# Patient Record
Sex: Female | Born: 1970
Health system: Southern US, Community
[De-identification: ages and names within clinical notes are randomized; demographics above are authoritative.]

## PROBLEM LIST (undated history)

## (undated) DIAGNOSIS — E0500 Thyrotoxicosis with diffuse goiter without thyrotoxic crisis or storm: Secondary | ICD-10-CM

## (undated) DIAGNOSIS — T7840XA Allergy, unspecified, initial encounter: Secondary | ICD-10-CM

## (undated) DIAGNOSIS — R011 Cardiac murmur, unspecified: Secondary | ICD-10-CM

## (undated) HISTORY — DX: Thyrotoxicosis with diffuse goiter without thyrotoxic crisis or storm: E05.00

## (undated) HISTORY — DX: Allergy, unspecified, initial encounter: T78.40XA

## (undated) HISTORY — DX: Cardiac murmur, unspecified: R01.1

## (undated) HISTORY — PX: OTHER SURGICAL HISTORY: SHX169

---

## 2010-08-26 ENCOUNTER — Other Ambulatory Visit (HOSPITAL_COMMUNITY): Payer: Self-pay | Admitting: Obstetrics and Gynecology

## 2010-08-26 DIAGNOSIS — R928 Other abnormal and inconclusive findings on diagnostic imaging of breast: Secondary | ICD-10-CM

## 2010-09-02 ENCOUNTER — Ambulatory Visit
Admission: RE | Admit: 2010-09-02 | Discharge: 2010-09-02 | Disposition: A | Discharge status: Home / Self Care | Payer: Self-pay | Source: Ambulatory Visit | Attending: Obstetrics and Gynecology | Admitting: Obstetrics and Gynecology

## 2010-09-02 DIAGNOSIS — R928 Other abnormal and inconclusive findings on diagnostic imaging of breast: Secondary | ICD-10-CM

## 2011-10-11 ENCOUNTER — Ambulatory Visit: Payer: Self-pay | Admitting: Obstetrics and Gynecology

## 2015-07-14 DIAGNOSIS — F4320 Adjustment disorder, unspecified: Secondary | ICD-10-CM | POA: Diagnosis not present

## 2015-08-19 DIAGNOSIS — F4320 Adjustment disorder, unspecified: Secondary | ICD-10-CM | POA: Diagnosis not present

## 2015-09-24 DIAGNOSIS — L709 Acne, unspecified: Secondary | ICD-10-CM | POA: Diagnosis not present

## 2015-09-24 DIAGNOSIS — E890 Postprocedural hypothyroidism: Secondary | ICD-10-CM | POA: Diagnosis not present

## 2015-09-24 DIAGNOSIS — E0500 Thyrotoxicosis with diffuse goiter without thyrotoxic crisis or storm: Secondary | ICD-10-CM | POA: Diagnosis not present

## 2015-09-24 DIAGNOSIS — E063 Autoimmune thyroiditis: Secondary | ICD-10-CM | POA: Diagnosis not present

## 2015-09-24 DIAGNOSIS — E785 Hyperlipidemia, unspecified: Secondary | ICD-10-CM | POA: Diagnosis not present

## 2015-10-01 DIAGNOSIS — E0500 Thyrotoxicosis with diffuse goiter without thyrotoxic crisis or storm: Secondary | ICD-10-CM | POA: Diagnosis not present

## 2015-10-01 DIAGNOSIS — L709 Acne, unspecified: Secondary | ICD-10-CM | POA: Diagnosis not present

## 2015-10-01 DIAGNOSIS — E890 Postprocedural hypothyroidism: Secondary | ICD-10-CM | POA: Diagnosis not present

## 2015-10-13 DIAGNOSIS — L709 Acne, unspecified: Secondary | ICD-10-CM | POA: Diagnosis not present

## 2015-10-13 DIAGNOSIS — E785 Hyperlipidemia, unspecified: Secondary | ICD-10-CM | POA: Diagnosis not present

## 2015-10-13 DIAGNOSIS — Z13820 Encounter for screening for osteoporosis: Secondary | ICD-10-CM | POA: Diagnosis not present

## 2015-10-13 DIAGNOSIS — D249 Benign neoplasm of unspecified breast: Secondary | ICD-10-CM | POA: Diagnosis not present

## 2015-10-13 DIAGNOSIS — E063 Autoimmune thyroiditis: Secondary | ICD-10-CM | POA: Diagnosis not present

## 2015-10-13 DIAGNOSIS — E890 Postprocedural hypothyroidism: Secondary | ICD-10-CM | POA: Diagnosis not present

## 2015-10-13 DIAGNOSIS — E0500 Thyrotoxicosis with diffuse goiter without thyrotoxic crisis or storm: Secondary | ICD-10-CM | POA: Diagnosis not present

## 2015-10-14 DIAGNOSIS — F4320 Adjustment disorder, unspecified: Secondary | ICD-10-CM | POA: Diagnosis not present

## 2015-10-31 DIAGNOSIS — E0500 Thyrotoxicosis with diffuse goiter without thyrotoxic crisis or storm: Secondary | ICD-10-CM | POA: Diagnosis not present

## 2015-10-31 DIAGNOSIS — E890 Postprocedural hypothyroidism: Secondary | ICD-10-CM | POA: Diagnosis not present

## 2015-10-31 DIAGNOSIS — L709 Acne, unspecified: Secondary | ICD-10-CM | POA: Diagnosis not present

## 2016-01-27 DIAGNOSIS — F4320 Adjustment disorder, unspecified: Secondary | ICD-10-CM | POA: Diagnosis not present

## 2016-02-25 DIAGNOSIS — F4320 Adjustment disorder, unspecified: Secondary | ICD-10-CM | POA: Diagnosis not present

## 2016-03-18 DIAGNOSIS — Z6821 Body mass index (BMI) 21.0-21.9, adult: Secondary | ICD-10-CM | POA: Diagnosis not present

## 2016-03-18 DIAGNOSIS — Z01419 Encounter for gynecological examination (general) (routine) without abnormal findings: Secondary | ICD-10-CM | POA: Diagnosis not present

## 2016-03-18 DIAGNOSIS — Z1231 Encounter for screening mammogram for malignant neoplasm of breast: Secondary | ICD-10-CM | POA: Diagnosis not present

## 2016-03-31 DIAGNOSIS — F4320 Adjustment disorder, unspecified: Secondary | ICD-10-CM | POA: Diagnosis not present

## 2016-06-11 DIAGNOSIS — L720 Epidermal cyst: Secondary | ICD-10-CM | POA: Diagnosis not present

## 2016-09-27 DIAGNOSIS — E890 Postprocedural hypothyroidism: Secondary | ICD-10-CM | POA: Diagnosis not present

## 2016-09-27 DIAGNOSIS — E0500 Thyrotoxicosis with diffuse goiter without thyrotoxic crisis or storm: Secondary | ICD-10-CM | POA: Diagnosis not present

## 2016-09-27 DIAGNOSIS — E785 Hyperlipidemia, unspecified: Secondary | ICD-10-CM | POA: Diagnosis not present

## 2016-09-30 DIAGNOSIS — E063 Autoimmune thyroiditis: Secondary | ICD-10-CM | POA: Diagnosis not present

## 2016-09-30 DIAGNOSIS — E890 Postprocedural hypothyroidism: Secondary | ICD-10-CM | POA: Diagnosis not present

## 2016-09-30 DIAGNOSIS — E0500 Thyrotoxicosis with diffuse goiter without thyrotoxic crisis or storm: Secondary | ICD-10-CM | POA: Diagnosis not present

## 2016-09-30 DIAGNOSIS — L709 Acne, unspecified: Secondary | ICD-10-CM | POA: Diagnosis not present

## 2016-09-30 DIAGNOSIS — E785 Hyperlipidemia, unspecified: Secondary | ICD-10-CM | POA: Diagnosis not present

## 2016-09-30 DIAGNOSIS — M545 Low back pain: Secondary | ICD-10-CM | POA: Diagnosis not present

## 2016-09-30 DIAGNOSIS — D249 Benign neoplasm of unspecified breast: Secondary | ICD-10-CM | POA: Diagnosis not present

## 2017-04-22 DIAGNOSIS — Z6820 Body mass index (BMI) 20.0-20.9, adult: Secondary | ICD-10-CM | POA: Diagnosis not present

## 2017-04-22 DIAGNOSIS — Z01419 Encounter for gynecological examination (general) (routine) without abnormal findings: Secondary | ICD-10-CM | POA: Diagnosis not present

## 2017-04-22 DIAGNOSIS — Z1231 Encounter for screening mammogram for malignant neoplasm of breast: Secondary | ICD-10-CM | POA: Diagnosis not present

## 2017-08-30 DIAGNOSIS — D2330 Other benign neoplasm of skin of unspecified part of face: Secondary | ICD-10-CM | POA: Diagnosis not present

## 2017-08-30 DIAGNOSIS — L570 Actinic keratosis: Secondary | ICD-10-CM | POA: Diagnosis not present

## 2017-10-05 DIAGNOSIS — E890 Postprocedural hypothyroidism: Secondary | ICD-10-CM | POA: Diagnosis not present

## 2017-10-05 DIAGNOSIS — E0500 Thyrotoxicosis with diffuse goiter without thyrotoxic crisis or storm: Secondary | ICD-10-CM | POA: Diagnosis not present

## 2017-10-05 DIAGNOSIS — E785 Hyperlipidemia, unspecified: Secondary | ICD-10-CM | POA: Diagnosis not present

## 2017-10-05 DIAGNOSIS — L709 Acne, unspecified: Secondary | ICD-10-CM | POA: Diagnosis not present

## 2017-10-10 DIAGNOSIS — D249 Benign neoplasm of unspecified breast: Secondary | ICD-10-CM | POA: Diagnosis not present

## 2017-10-10 DIAGNOSIS — L709 Acne, unspecified: Secondary | ICD-10-CM | POA: Diagnosis not present

## 2017-10-10 DIAGNOSIS — E063 Autoimmune thyroiditis: Secondary | ICD-10-CM | POA: Diagnosis not present

## 2017-10-10 DIAGNOSIS — E0500 Thyrotoxicosis with diffuse goiter without thyrotoxic crisis or storm: Secondary | ICD-10-CM | POA: Diagnosis not present

## 2017-10-10 DIAGNOSIS — M545 Low back pain: Secondary | ICD-10-CM | POA: Diagnosis not present

## 2017-10-10 DIAGNOSIS — E785 Hyperlipidemia, unspecified: Secondary | ICD-10-CM | POA: Diagnosis not present

## 2017-10-10 DIAGNOSIS — E890 Postprocedural hypothyroidism: Secondary | ICD-10-CM | POA: Diagnosis not present

## 2017-10-22 ENCOUNTER — Emergency Department
Admission: EM | Admit: 2017-10-22 | Discharge: 2017-10-22 | Disposition: A | Discharge status: Home / Self Care | Payer: 59 | Attending: Emergency Medicine | Admitting: Emergency Medicine

## 2017-10-22 ENCOUNTER — Encounter: Payer: Self-pay | Admitting: Emergency Medicine

## 2017-10-22 ENCOUNTER — Other Ambulatory Visit: Payer: Self-pay

## 2017-10-22 ENCOUNTER — Emergency Department: Payer: 59

## 2017-10-22 DIAGNOSIS — Y929 Unspecified place or not applicable: Secondary | ICD-10-CM | POA: Insufficient documentation

## 2017-10-22 DIAGNOSIS — S32501A Unspecified fracture of right pubis, initial encounter for closed fracture: Secondary | ICD-10-CM | POA: Insufficient documentation

## 2017-10-22 DIAGNOSIS — Z79899 Other long term (current) drug therapy: Secondary | ICD-10-CM | POA: Diagnosis not present

## 2017-10-22 DIAGNOSIS — S32591A Other specified fracture of right pubis, initial encounter for closed fracture: Secondary | ICD-10-CM

## 2017-10-22 DIAGNOSIS — S79911A Unspecified injury of right hip, initial encounter: Secondary | ICD-10-CM | POA: Diagnosis present

## 2017-10-22 DIAGNOSIS — S32414A Nondisplaced fracture of anterior wall of right acetabulum, initial encounter for closed fracture: Secondary | ICD-10-CM | POA: Diagnosis not present

## 2017-10-22 DIAGNOSIS — M545 Low back pain: Secondary | ICD-10-CM | POA: Diagnosis not present

## 2017-10-22 DIAGNOSIS — Y998 Other external cause status: Secondary | ICD-10-CM | POA: Insufficient documentation

## 2017-10-22 DIAGNOSIS — Y9352 Activity, horseback riding: Secondary | ICD-10-CM | POA: Insufficient documentation

## 2017-10-22 MED ORDER — MELOXICAM 15 MG PO TABS: 15.0000 mg | ORAL_TABLET | Freq: Every day | ORAL | 0 refills | Status: DC

## 2017-10-22 MED ORDER — MELOXICAM 7.5 MG PO TABS
15.0000 mg | ORAL_TABLET | Freq: Once | ORAL | Status: AC
  Administered 2017-10-22: 15 mg via ORAL
  Filled 2017-10-22: qty 2

## 2017-10-22 MED ORDER — OXYCODONE-ACETAMINOPHEN 5-325 MG PO TABS: 1.0000 | ORAL_TABLET | Freq: Four times a day (QID) | ORAL | 0 refills | Status: DC | PRN

## 2017-10-22 NOTE — ED Notes (Signed)
Pt fitted with walker - pt was unable to use crutches d/t pelvic fx. Pt discharged and taken out to the car

## 2017-10-22 NOTE — ED Triage Notes (Signed)
Arrives with c/o right leg, hip, back pain after being bucked off of a horse today at around 1400.  Patient states she has been ambulatory since accident.  Patient has voided without difficulty since fall.

## 2017-10-22 NOTE — ED Provider Notes (Signed)
[facility] Emergency Department Provider Note  ____________________________________________  Time seen: Approximately 6:37 PM  I have reviewed the triage vital signs and the nursing notes.   HISTORY  Chief Complaint Fall    HPI Sonya Hart is a 47 y.o. female who presents emergency department complaining of right hip/groin pain and lower back pain status post a fall off a horse.  Patient reports that she fell over the left side of the horse, landing on left hip.  She was able to stand and remount the horse after the fall.  She did not hit her head or lose consciousness.  Initially, the pain in the right hip was minimal but she has had increasing sharp pain in the groin region.  Patient reports some mild, "tightness" to the lumbar spine.  No specific pain.  She denies any bowel or bladder dysfunction, saddle anesthesia, paresthesias.  Patient was ambulatory immediately after the fall.  Since then, ambulation increases the pain drastically.  No medications for this complaint prior to arrival.  No other injury or complaint at this time.  History reviewed. No pertinent past medical history.  There are no active problems to display for this patient.   History reviewed. No pertinent surgical history.  Prior to Admission medications   Medication Sig Start Date End Date Taking? Authorizing Provider  SYNTHROID 75 MCG tablet Take 75 mcg by mouth daily. 09/01/17  Yes [provider]  TAZORAC 0.05 % cream Apply 1 application topically as directed. 10/10/17  Yes [provider]  meloxicam (MOBIC) 15 MG tablet Take 1 tablet (15 mg total) by mouth daily. 10/22/17   Zedekiah Hinderman, Charline Bills, PA-C  oxyCODONE-acetaminophen (PERCOCET/ROXICET) 5-325 MG tablet Take 1 tablet by mouth every 6 (six) hours as needed for severe pain. 10/22/17   Dniya Neuhaus, Charline Bills, PA-C    Allergies Patient has no known allergies.  No family history on file.  Social  History Social History   Tobacco Use  . Smoking status: Never Smoker  . Smokeless tobacco: Never Used  Substance Use Topics  . Alcohol use: Not on file  . Drug use: Not on file     Review of Systems  Constitutional: No fever/chills Eyes: No visual changes.  Cardiovascular: no chest pain. Respiratory: no cough. No SOB. Gastrointestinal: No abdominal pain.  No nausea, no vomiting.   Musculoskeletal: Positive for lower back, right hip/groin pain Skin: Negative for rash, abrasions, lacerations, ecchymosis. Neurological: Negative for headaches, focal weakness or numbness. 10-point ROS otherwise negative.  ____________________________________________   PHYSICAL EXAM:  VITAL SIGNS: ED Triage Vitals  Enc Vitals Group     BP 10/22/17 1738 120/81     Pulse Rate 10/22/17 1738 94     Resp 10/22/17 1738 16     Temp 10/22/17 1738 98.3 F (36.8 C)     Temp Source 10/22/17 1738 Oral     SpO2 10/22/17 1738 100 %     Weight 10/22/17 1739 118 lb (53.5 kg)     Height 10/22/17 1739 5\' 2"  (1.575 m)     Head Circumference --      Peak Flow --      Pain Score 10/22/17 1739 4     Pain Loc --      Pain Edu? --      Excl. in [place]? --      Constitutional: Alert and oriented. Well appearing and in no acute distress. Eyes: Conjunctivae are normal. PERRL. EOMI. Head: Atraumatic. Neck: No stridor.  Cardiovascular: Normal rate, regular rhythm. Normal S1 and S2.  Good peripheral circulation. Respiratory: Normal respiratory effort without tachypnea or retractions. Lungs CTAB. Good air entry to the bases with no decreased or absent breath sounds. Musculoskeletal: Full range of motion to all extremities. No gross deformities appreciated.  No visible deformity, ecchymosis, abrasions or lacerations to the lumbar spine.  Diffuse tenderness to palpation in the lower lumbar spine with no specific point tenderness or palpable abnormality.  No step-off.  Examination of the right hip reveals no deformity,  shortening or rotation of the lower extremity.  Patient is very tender to palpation in the inguinal region with no palpable abnormality.  No other tenderness to palpation of the hips or pelvis.  Examination of the knee is unremarkable.  Dorsalis pedis pulse intact bilateral lower extremities.  Sensation intact and equal in all dermatomal distributions bilateral lower extremities. Neurologic:  Normal speech and language. No gross focal neurologic deficits are appreciated.  Skin:  Skin is warm, dry and intact. No rash noted. Psychiatric: Mood and affect are normal. Speech and behavior are normal. Patient exhibits appropriate insight and judgement.   ____________________________________________   LABS (all labs ordered are listed, but only abnormal results are displayed)  Labs Reviewed - No data to display ____________________________________________  EKG   ____________________________________________  RADIOLOGY Diamantina Providence Jazmarie Biever, personally viewed and evaluated these images (plain radiographs) as part of my medical decision making, as well as reviewing the written report by the radiologist.  Dg Lumbar Spine Complete  Result Date: 10/22/2017 CLINICAL DATA:  Bucked off a horse today at 1400 hours, having RIGHT leg, hip and back pain, initial encounter EXAM: LUMBAR SPINE - COMPLETE 4+ VIEW COMPARISON:  None FINDINGS: Osseous mineralization normal. Five non-rib-bearing lumbar vertebra. Slight disc space narrowing and endplate spur formation at L1-L2. Vertebral body and disc space heights otherwise maintained. No acute fracture, subluxation or bone destruction. SI joints symmetric. Scattered pelvic phleboliths. IMPRESSION: Mild degenerative disc disease changes at L1-L2. No acute bony abnormalities. Electronically Signed   By: Lavonia Dana M.D.   On: 10/22/2017 20:37   Dg Hip Unilat W Or Wo Pelvis 2-3 Views Right  Result Date: 10/22/2017 CLINICAL DATA:  RIGHT leg, hip, and back pain after  being bucked off a horse today at 1400 hours, initial encounter EXAM: DG HIP (WITH OR WITHOUT PELVIS) 2-3V RIGHT COMPARISON:  None FINDINGS: Osseous mineralization normal. Hip and SI joint spaces preserved. No femoral fracture or dislocation. Nondisplaced fracture inferior RIGHT pubic ramus. Questionable nondisplaced fracture RIGHT superior pubic ramus. Mild offset at the pubic symphysis can be physiologic from ligamentous laxity. No additional focal bony abnormalities identified. IMPRESSION: Nondisplaced fractures RIGHT inferior and questionably RIGHT superior pubic rami. Slight offset of the pubic symphysis, potentially physiologic but can also be seen with pubic symphysis dysfunction and acute ligamentous injury. Electronically Signed   By: Lavonia Dana M.D.   On: 10/22/2017 20:36    ____________________________________________    PROCEDURES  Procedure(s) performed:    Procedures    Medications  meloxicam (MOBIC) tablet 15 mg (15 mg Oral Given 10/22/17 2121)     ____________________________________________   INITIAL IMPRESSION / ASSESSMENT AND PLAN / ED COURSE  Pertinent labs & imaging results that were available during my care of the patient were reviewed by me and considered in my medical decision making (see chart for details).  Review of the [place] CSRS was performed in accordance of the [place] prior to dispensing any controlled drugs.  Patient's diagnosis is consistent with pubic ramus fracture and widening of the pubic symphysis.  Patient presents status post falling off a horse.  Patient presented with right hip/groin pain.  Differential included groin strain, quadriceps injury, fracture.  X-rays revealed nondisplaced pubic ramus fracture with widening of the pubic symphysis.  I have advised patient to be nonweightbearing to the area and follow-up with orthopedics for further management.  Patient will be prescribed #20 of Percocet and meloxicam for symptom improvement..  Exam  was otherwise reassuring and no indication for further workup.  Patient is given ED precautions to return to the ED for any worsening or new symptoms.     ____________________________________________  FINAL CLINICAL IMPRESSION(S) / ED DIAGNOSES  Final diagnoses:  Closed fracture of ramus of right pubis, initial encounter ([place])      NEW MEDICATIONS STARTED DURING THIS VISIT:  ED Discharge Orders        Ordered    oxyCODONE-acetaminophen (PERCOCET/ROXICET) 5-325 MG tablet  Every 6 hours PRN     10/22/17 2110    meloxicam (MOBIC) 15 MG tablet  Daily     10/22/17 2110          This chart was dictated using voice recognition software/Dragon. Despite best efforts to proofread, errors can occur which can change the meaning. Any change was purely unintentional.    Brynda Peon 10/22/17 2147    Lavonia Drafts, MD 10/22/17 2233

## 2017-10-24 DIAGNOSIS — S329XXA Fracture of unspecified parts of lumbosacral spine and pelvis, initial encounter for closed fracture: Secondary | ICD-10-CM | POA: Insufficient documentation

## 2017-11-02 DIAGNOSIS — L578 Other skin changes due to chronic exposure to nonionizing radiation: Secondary | ICD-10-CM | POA: Diagnosis not present

## 2017-11-02 DIAGNOSIS — L02212 Cutaneous abscess of back [any part, except buttock]: Secondary | ICD-10-CM | POA: Diagnosis not present

## 2017-11-02 DIAGNOSIS — D485 Neoplasm of uncertain behavior of skin: Secondary | ICD-10-CM | POA: Diagnosis not present

## 2017-11-02 DIAGNOSIS — L570 Actinic keratosis: Secondary | ICD-10-CM | POA: Diagnosis not present

## 2017-11-03 DIAGNOSIS — S329XXD Fracture of unspecified parts of lumbosacral spine and pelvis, subsequent encounter for fracture with routine healing: Secondary | ICD-10-CM | POA: Diagnosis not present

## 2017-11-08 ENCOUNTER — Ambulatory Visit: Payer: 59 | Admitting: Physical Therapy

## 2017-11-16 ENCOUNTER — Ambulatory Visit: Payer: 59 | Admitting: Physical Therapy

## 2017-11-22 ENCOUNTER — Ambulatory Visit: Payer: 59 | Admitting: Physical Therapy

## 2017-11-29 ENCOUNTER — Ambulatory Visit: Payer: 59 | Admitting: Physical Therapy

## 2017-12-06 ENCOUNTER — Encounter: Payer: Self-pay | Admitting: Physical Therapy

## 2017-12-06 DIAGNOSIS — S329XXD Fracture of unspecified parts of lumbosacral spine and pelvis, subsequent encounter for fracture with routine healing: Secondary | ICD-10-CM | POA: Diagnosis not present

## 2017-12-13 ENCOUNTER — Encounter: Payer: Self-pay | Admitting: Physical Therapy

## 2017-12-13 ENCOUNTER — Ambulatory Visit: Payer: 59 | Attending: Orthopedic Surgery | Admitting: Physical Therapy

## 2017-12-13 ENCOUNTER — Other Ambulatory Visit: Payer: Self-pay

## 2017-12-13 DIAGNOSIS — M6281 Muscle weakness (generalized): Secondary | ICD-10-CM | POA: Diagnosis not present

## 2017-12-13 DIAGNOSIS — M791 Myalgia, unspecified site: Secondary | ICD-10-CM | POA: Diagnosis not present

## 2017-12-13 DIAGNOSIS — M4126 Other idiopathic scoliosis, lumbar region: Secondary | ICD-10-CM | POA: Diagnosis not present

## 2017-12-13 DIAGNOSIS — M533 Sacrococcygeal disorders, not elsewhere classified: Secondary | ICD-10-CM | POA: Diagnosis not present

## 2017-12-13 NOTE — Patient Instructions (Addendum)
Strengthening R hips  Clam Shell 45 Degrees  R leg only  , lying on L side     Lying with hips and knees bent 45, one pillow between knees and ankles. Lift knee with exhale. Be sure pelvis does not roll backward. Do not arch back. Do 15 times, each leg, 2 times per day.  http://ss.exer.us/75   Copyright  VHI. All rights reserved.    __________  Stretch to relieve the tightness on the R low back    Reclined twist for hips and side of the hips/ legs  Lay on your back, knees bend Scoot hips to the R , leave shoulders in place Pillow between knees and drop knees to the L,  Head rotates to the R  Shoulder blades flat, arms stretch by side  5 breaths   Switch sides    ____  Standing version to  Relieve  the tightness on the R low back   1) forward stretch at counter, R hand on L thigh , pushing both feet into ground and pulling buttocks back   2) seated twist     REVERSE    : hands on chair  Feet are hip width apart, L foot one behind like you are on ski tracks,  R knee bent over ankle but not more forward then the ankle.  Make sure 50% weight is in the front foot/leg , 50% weight is the back foot/ leg    Rest R forearm lightly on top of thigh,  L hand on L hip.  Inhale lengthen spine,   Exhale turn navel to the L then the ribcage turns, look at the other wall.  Keep maintaining  50% weight is in the front foot/leg , 50% weight is the back foot/ leg  And make sure the front knee is still pointed in the toe line of the 2nd toe.   3 breaths here.       __ Sitting posture:  feet on the floor   pelvic tilts to find neutral onto sitting bones

## 2017-12-14 NOTE — Therapy (Addendum)
[place] MAIN [facility] SERVICES [address] [place], [place], [address] Phone: [phone]   Fax:  [phone]  Physical Therapy Evaluation  Patient Details  Name: Sonya Hart MRN: [id number] Date of Birth: December 11, 1970 Referring Provider: Harlow Mares   Encounter Date: 12/13/2017  PT End of Session - 12/14/17 2201    Visit Number  1    Number of Visits  12    Date for PT Re-Evaluation  03/07/18    PT Start Time  0905    PT Stop Time  8101    PT Time Calculation (min)  100 min       Past Medical History:  Diagnosis Date  . Graves disease    radiation to thyroid 1992    History reviewed. No pertinent surgical history.  There were no vitals filed for this visit.   Subjective Assessment - 12/14/17 2142    Subjective  1) R pubic rami Fx s/p post 7 weeks:  Pt had a fall from a horse  on October 22, 2017.   Pt landed on her tailbone and fell back on L side of her hip. Pt was able to get up and walk. Pt felt R groin pain and L sitting bone pain. Later that night, a couple of hours later, the pain was excuriating and she could not put weight on it. Pt got Xrays which showed Fx to non-displaced Fx f the R inferior pubic rami with a disloaction pubic symphysis.  Currently residue pain she is experiencing includes : sitting for > 30 min , she feels stiff in the R groin and a pinching pain in the R posterior glut when lifting her suitcase ( 10 lbs). At work, pt is sitting  most of the day with some standing.   2) LBP started 1 week after her fall . Pt thinks her LBP is related to being immobile and sitting on her couch on weight restrictions. The only time she notices this LBP now is when she was sitting for long periods , like on flight.  Pt has been riding horse for 20 years with 10 falls onto hips without Fx prior to this recent fall.    3) Prior to the fall:  Pt reports she feels her R side is unbalanced (R shoulder rotation and R leg feels shorter)  when she rides horses. Hx of riding for 20 years.  Pt has felt R side to be stronger and L more supple.  Pt states when she rides, pt is sitting tall, and R hand is pulling the reins and her trunk rotates R and causes her body  to lean to the L.  Post injury: pt has noticed weakness and atrophy on her R LE          Pertinent History  prior to injury : horseback riding 3-4x / week, 30 min treadmill 2-3x week, weight lifting dumbbells UE Sat-Sun  , yoga ( video, 1 hour on both and Sat and Sun)   . currently: this weekend, walked .25 mile x 2 with rest break with dog and a walker  ( 7 weeks post injury)  .  Pt is no longer using her walker at all now. Pt was working 3 x day a week 8 hours at a time  which was her work srestrictions and also no lifting > 10 lbs. Pt is ready to increase work to 5 days a week and will be goin g to see her MD after  today's visit to discuss increasing her work frequency.       Patient Stated Goals  get back riding and normal gym workout.          [facility] PT Assessment - 12/14/17 2142      Assessment   Medical Diagnosis  Fx of Lumbosacral and pelvis     Referring Provider  Harlow Mares      Precautions   Precautions Gentle strengthening. ROM per MD order     Restrictions   Weight Bearing Restrictions  Weight bearing as tolerated      Balance Screen   Has the patient fallen in the past 6 months  Yes    How many times?  1    Has the patient had a decrease in activity level because of a fear of falling?   No      Observation/Other Assessments   Observations  crossed legs. posterior tilt of sacrum, slumped,  80 cm medial malleoli to AIIS B       Coordination   Gross Motor Movements are Fluid and Coordinated  -- R scapula downward rotation delayed, R shoulder slightly     Fine Motor Movements are Fluid and Coordinated  -- chest breathing      Other:   Other/ Comments  seated at work with repeated trunk rotation to R       Strength   Overall Strength Comments  R knee  flexion 3/5, L 4/5   hip flexion B 4+/5    knee extension 4-/5 B,    thoracolumbar ( scaption L UE with R glut ext with R glut ext 3-/5, R UE-L hip ext 4/5)    hip abd R 3+/5     L 4/5        Palpation   Spinal mobility  R convex at L1-2 ( with increased R paraspinal mm bulk > L)  at lumbar spine, increased QL mm R     SI assessment   R iliacrest, PSIS  lower than L in standing.   R ASIS more anterior/ inferior ( pre Tx),  levelled AIIS post Tx                 Objective measurements completed on examination: See above findings.      [facility] Adult PT Treatment/Exercise - 12/14/17 2234      Neuro Re-ed    Neuro Re-ed Details   see pt instructions       Manual Therapy   Manual therapy comments  lower trunk rotaion with sustained pressure and STM along QL on R, FADDIR on L. external manual at R obt int/ coccygeus , superior glide at sacrum to faciliate anterior tilt of pelvis              PT Education - 12/14/17 2200    Education Details  POC, anatomy, physiology, HEP, goals     Person(s) Educated  Patient    Methods  Explanation;Demonstration;Tactile cues;Verbal cues;Handout    Comprehension  Returned demonstration;Verbalized understanding          PT Long Term Goals - 12/14/17 2256      PT LONG TERM GOAL #1   Title  Pt will demo increased hip strength flexion, abduction, knee extension B 4/5 in order to progress to loaded, upright  exercises to return to fitness routines    Time  4    Period  Weeks    Status  New    Target Date  01/11/18  PT LONG TERM GOAL #2   Title  Pt will demo decreased R paraspinal/ QL/ pelvic floor tensions and tenderness with less R deviation of lumbar segments at L1-2 across 2 visits in order to restore proper sitting posture and to return to horseback riding     Time  3    Period  Weeks    Status  New    Target Date  01/04/18      PT LONG TERM GOAL #3   Title  Pt will demo IND with HEP, fitness exercises with weights/  resistance bands to minimize injuries,  and work modifications with sitting, turning at her work station , and lifting her suitcase with proper mechanics     Time  6    Period  Weeks    Status  New    Target Date  01/25/18      PT LONG TERM GOAL #4   Title  Pt will decrease her PGQ score from % to <% in order to have less pain with sit to stand, lifting     Time  6    Period  Weeks    Status  New    Target Date  01/25/18             Plan - 12/14/17 2202    Clinical Impression Statement  Pt is 47 yo female who arrives to PT 7 weeks s/p Fx to R inferior pubic and possibly to her superior pubic rami after sustaining a fall off a horse. 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 Pt also complains of LBP that started after her fall.  In addition, pt reports that prior to her fall, pt had noticed R sided mm asymmetries ( R rounded shoulder, shorter R leg) and after the fall, she has noticed atrophy and weakness to her RLE.  Pt's clinical presentation includes:  _ sacroiliac dysfunction with limited nutation of sacrum  with likely associated slumped sitting/ posterior tilt of her pelvis _ lumbar convex curve to the R with increased R  ( neg for leg length difference)  _ paraspinal/ QL mm tightness with likely association with the repeated trunk rotation to the R that she performs at work to type on her computer _ R hip and LE weakness andincreased R pelvic floor tightness _weakness to thoracolumbar mm B  _poor posture and body mechanics  _scapular dyskinesis on R    Following Tx today, pt showed increased anterior tilt of pelvis, decreased tightness of R pelvic floor mm and R paraspinal/ QL mm. Pt was provided complimentary stretches to maintain mm flexibility on R to counteract her repeated trunk to R at work. Pt was also educated and demo'd correctly  proper sitting posture. Plan to address minimizing the weight of her work Sports coach for less lifting in this early stage of her healing process with her pelvic Fx. Plan to initiate deep core/ pelvic floor strengthen at upcoming visit.   From today's PT's evaluation, pt is ready to return to regular work schedule of 5 days/ week but will require (4) 10 min breaks and 30 min lunch break across a 8 hr work day in order to minimize long periods of sitting and mm imbalance for her repeated motion and to optimize walking for pelvic healing.    MD order notes:  Pubic rami Fx, weight bearing as tolerated, gentle strengthening, ROM        Clinical Presentation  Evolving    Clinical Decision Making  High    Rehab Potential  Good    PT Frequency  2x / week    PT Duration  6 weeks    PT Treatment/Interventions  Therapeutic exercise;Therapeutic activities;Patient/family education;Balance training;Gait training;Moist Heat;Traction;Stair training;Neuromuscular re-education;Manual techniques;Scar mobilization    Consulted and Agree with Plan of Care  Patient       Patient will benefit from skilled therapeutic intervention in order to improve the following deficits and impairments:  Improper body mechanics, Pain, Decreased mobility, Decreased coordination, Decreased scar mobility, Increased muscle spasms, Postural dysfunction, Decreased endurance, Decreased activity tolerance, Decreased balance, Decreased strength, Decreased range of motion, Hypomobility  Visit Diagnosis: Sacrococcygeal disorders, not elsewhere classified  Myalgia  Muscle weakness (generalized)  Other idiopathic scoliosis, lumbar region     Problem List There are no active problems to display for this patient.   [name] ,PT, DPT, E-RYT  12/14/2017, 11:10 PM  [place] MAIN [facility] SERVICES [address] [place], [place], [address] Phone: [phone]   Fax:   [phone]  Name: Sonya Hart MRN: [id number] Date of Birth: Mar 01, 1971

## 2017-12-14 NOTE — Addendum Note (Signed)
Addended by: [name] on: 12/14/2017 11:12 PM   Modules accepted: Orders

## 2017-12-15 ENCOUNTER — Ambulatory Visit: Payer: 59 | Admitting: Physical Therapy

## 2017-12-15 DIAGNOSIS — M533 Sacrococcygeal disorders, not elsewhere classified: Secondary | ICD-10-CM

## 2017-12-15 DIAGNOSIS — M6281 Muscle weakness (generalized): Secondary | ICD-10-CM | POA: Diagnosis not present

## 2017-12-15 DIAGNOSIS — M4126 Other idiopathic scoliosis, lumbar region: Secondary | ICD-10-CM

## 2017-12-15 DIAGNOSIS — M791 Myalgia, unspecified site: Secondary | ICD-10-CM | POA: Diagnosis not present

## 2017-12-15 NOTE — Patient Instructions (Signed)
Switch out work shoes to a wider toe box in second half of the day if wearing narrow toe shoes to work that day,  Left a pair   Walking 1 mile, short strides but with a slight lean for navel over shin bone, mid and forefoot pressinto ground for push off  Breathing, relaxed shoulders and arm swings    Exhale and ski track stance with lifting gallon of milk  And moving feet first before turning body   Brushing horse, standing parallel to horse to stretch pects, elbow bends  , ski track stance small distance, switch front leg     Laptop and purse and folder into bookbag, wear on your back while pulling crate, lower backpack into trunk first and then lifting crate which is now lighter    ___  Deep core strengthening  ( x 2 )   ( see handout)  Level 1 with quck gentle squeeze of pelvic floor on exhale ( 10 reps)   Level 2 6 min

## 2017-12-15 NOTE — Therapy (Signed)
[place] MAIN [facility] SERVICES [address] [place], [place], [address] Phone: [phone]   Fax:  [phone]  Physical Therapy Treatment  Patient Details  Name: Sonya Hart MRN: [id number] Date of Birth: 1971/03/07 Referring Provider: Harlow Mares   Encounter Date: 12/15/2017  PT End of Session - 12/15/17 0922    Visit Number  2    Number of Visits  12    Date for PT Re-Evaluation  03/07/18    PT Start Time  0803    PT Stop Time  0904    PT Time Calculation (min)  61 min       Past Medical History:  Diagnosis Date  . Graves disease    radiation to thyroid 1992    No past surgical history on file.  There were no vitals filed for this visit.  Subjective Assessment - 12/15/17 0814    Subjective  Pt reported noticing soreness/ pain in the R back glut area when picking up a gallon of milk. Walking long strides also causes pain in this area. Pt's MD cleared her for return to work at 5 days a week with 10 min work breaks x 4 within the 8 hour work day. Pt is still on 10 lb lifting restrictions     Pertinent History  lifting restrictions < 10 lbs.  prior to injury : horseback riding 3-4x / week, 30 min treadmill 2-3x week, weight lifting dumbbells UE Sat-Sun  , yoga ( video, 1 hour on both and Sat and Sun)   . currently: this weekend, walked .25 mile x 2 with rest break with dog and a walker  ( 7 weeks post injury)  .  Pt is no longer using her walker at all now. Pt was working 3 x day a week 8 hours at a time  which was her work srestrictions and also no lifting > 10 lbs. Pt is ready to increase work to 5 days a week and will be goin g to see her MD after today's visit to discuss increasing her work frequency.  MD order notes: Pubic rami Fx, weight bearing as tolerated, gentle strengthening, ROM      Patient Stated Goals  get back riding and normal gym workout.          [facility] PT Assessment - 12/15/17 0819      Precautions   Precautions  --  lifting restrictions < 10 lb      Single Leg Stance   Comments   Trendelenberg on R SLS        Palpation   SI assessment   B PSIS levelled , R paraspinal significantly less tensions, remaining slight curve to R lumbar     Ambulation/Gait   Gait Comments  0.86 m/s short stride, heel strike  , posterior COM                 Pelvic Floor Special Questions - 12/15/17 0919    External Perineal Exam  decreased tightness along obt int coccygeus , with slight tightness at obt ext, adductor brevis/ magnus ( inferior rami)     External Palpation  overuse of gluts and adductor with cue for pelvic floor contraction        [facility] Adult PT Treatment/Exercise - 12/15/17 4196      Therapeutic Activites    Therapeutic Activities  -- see pt instructions      Neuro Re-ed    Neuro Re-ed Details   see  pt instructions      Manual Therapy   Manual therapy comments   STM R obt int coccygeus , with slight tightness at obt ext, adductor brevis/ magnus ( inferior rami)              PT Education - 12/15/17 0919    Education Details  HEP    Person(s) Educated  Patient    Methods  Explanation;Demonstration;Tactile cues;Verbal cues;Handout    Comprehension  Returned demonstration;Verbalized understanding          PT Long Term Goals - 12/14/17 2256      PT LONG TERM GOAL #1   Title  Pt will demo increased hip strength flexion, abduction, knee extension B 4/5 in order to progress to loaded, upright  exercises to return to fitness routines    Time  4    Period  Weeks    Status  New    Target Date  01/11/18      PT LONG TERM GOAL #2   Title  Pt will demo decreased R paraspinal/ QL/ pelvic floor tensions and tenderness with less R deviation of lumbar segments at L1-2 across 2 visits in order to restore proper sitting posture and to return to horseback riding     Time  3    Period  Weeks    Status  New    Target Date  01/04/18      PT LONG TERM GOAL #3   Title  Pt will demo IND  with HEP, fitness exercises with weights/ resistance bands to minimize injuries,  and work modifications with sitting, turning at her work station , and lifting her suitcase with proper mechanics     Time  6    Period  Weeks    Status  New    Target Date  01/25/18      PT LONG TERM GOAL #4   Title  Pt will decrease her PGQ score from % to <% in order to have less pain with sit to stand, lifting     Time  6    Period  Weeks    Status  New    Target Date  01/25/18            Plan - 12/15/17 0819    Clinical Impression Statement  Pt is progressing well with no increased pain with HEP and advanced to deep core and pelvic floor strengthening today which will help with increasing pelvic girdle stability.  Pt demo'd correctly after cues to avoid accessory overuse of gluts and adductors. Pt showed improved SIJ mobility from last session. Pt was educated on improved gait mecahnics to co-activate lower kinetci chain. Plan to add posterior chain eccentric control exercise at upcoming session.  Modified functional activities: with co-activation of deep core and more BLE use with lifting gallon of milk, brushing horse with pect stretch, and minisquat lifting of work crate with exhale, and dividing up original weight of 28 lbs in work crate into 15lb (into back pack) and 13 lb (of work crate).  Advised pt to decrease work crate to 10 lbs to adhere to MD's weight lifting restrictions.  Pt continues to benefit from skilled PT.    History and Personal Factors relevant to plan of care:  lifting restrictions < 10 lb    Rehab Potential  Good    PT Frequency  2x / week    PT Duration  6 weeks    PT Treatment/Interventions  Therapeutic exercise;Therapeutic activities;Patient/family  education;Balance training;Gait training;Moist Heat;Traction;Stair training;Neuromuscular re-education;Manual techniques;Scar mobilization    Consulted and Agree with Plan of Care  Patient       Patient will benefit from skilled  therapeutic intervention in order to improve the following deficits and impairments:  Improper body mechanics, Pain, Decreased mobility, Decreased coordination, Decreased scar mobility, Increased muscle spasms, Postural dysfunction, Decreased endurance, Decreased activity tolerance, Decreased balance, Decreased strength, Decreased range of motion, Hypomobility  Visit Diagnosis: Myalgia  Sacrococcygeal disorders, not elsewhere classified  Muscle weakness (generalized)  Other idiopathic scoliosis, lumbar region     Problem List There are no active problems to display for this patient.   [name] ,PT, DPT, E-RYT  12/15/2017, 9:30 AM  [place] MAIN [facility] SERVICES [address] [place], [place], [address] Phone: [phone]   Fax:  [phone]  Name: Sonya Hart MRN: [id number] Date of Birth: [birth year]

## 2017-12-21 ENCOUNTER — Encounter: Payer: Self-pay | Admitting: Physical Therapy

## 2017-12-21 ENCOUNTER — Ambulatory Visit: Payer: 59 | Admitting: Physical Therapy

## 2017-12-21 DIAGNOSIS — M6281 Muscle weakness (generalized): Secondary | ICD-10-CM

## 2017-12-21 DIAGNOSIS — M4126 Other idiopathic scoliosis, lumbar region: Secondary | ICD-10-CM | POA: Diagnosis not present

## 2017-12-21 DIAGNOSIS — M791 Myalgia, unspecified site: Secondary | ICD-10-CM

## 2017-12-21 DIAGNOSIS — M533 Sacrococcygeal disorders, not elsewhere classified: Secondary | ICD-10-CM | POA: Diagnosis not present

## 2017-12-21 NOTE — Patient Instructions (Signed)
Feet care :  Self -feet massage   Handshake : fingers between toes, moving ballmounds/toes back and forth several times while other hand anchors at arch. Do the same at the hind/mid foot.  Heel to toes upward to a letter Big Letter T strokes to spread ballmounds and toes, several times, pinch between webs of toes  Run finger tips along top of foot between long bones "comb between the bones"    Wiggle toes and spread them out when relaxing    ________    [place] at waist connected to doorknob 4mins Stepping forward normal length steps, planting mid and forefoot down, center of mass ( navel) leans forward slightly as if you were walking uphill 3-4 steps till band feels taut ( MAKE SURE THE DOOR IS LOCKED AND WON'T OPEN)     ________  No walking laps on uneven ground yet... Walk 15 min ( 1 mile) x 2  on level ground   Stepping backwards, lower heel slowly, carry trunk and hips back as you step

## 2017-12-22 NOTE — Therapy (Signed)
[place] MAIN [facility] SERVICES [address] [place], [place], [address] Phone: [phone]   Fax:  [phone]  Physical Therapy Treatment  Patient Details  Name: Sonya Hart MRN: [id number] Date of Birth: 20-Aug-1970 Referring Provider: Harlow Mares   Encounter Date: 12/21/2017  PT End of Session - 12/22/17 2117    Visit Number  3    Number of Visits  12    Date for PT Re-Evaluation  03/07/18    PT Start Time  3299    PT Stop Time  1820    PT Time Calculation (min)  70 min       Past Medical History:  Diagnosis Date  . Graves disease    radiation to thyroid 1992    No past surgical history on file.  There were no vitals filed for this visit.  Subjective Assessment - 12/21/17 1713    Subjective  Pt reported she had no pain wtih activities ( combing horse with ski track stance, lifting milk bottle).  Pt is using the bookpack to lighten her load liftign her crate into the trunk.  Pt walked in nature on uneven ground for 15 mi x 2 with mild hills without pain.        Pertinent History  lifting restrictions < 10 lbs.  prior to injury : horseback riding 3-4x / week, 30 min treadmill 2-3x week, weight lifting dumbbells UE Sat-Sun  , yoga ( video, 1 hour on both and Sat and Sun)   . currently: this weekend, walked .25 mile x 2 with rest break with dog and a walker  ( 7 weeks post injury)  .  Pt is no longer using her walker at all now. Pt was working 3 x day a week 8 hours at a time  which was her work srestrictions and also no lifting > 10 lbs. Pt is ready to increase work to 5 days a week and will be goin g to see her MD after today's visit to discuss increasing her work frequency.  MD order notes: Pubic rami Fx, weight bearing as tolerated, gentle strengthening, ROM      Patient Stated Goals  get back riding and normal gym workout.          [place] [facility] PT Assessment - 12/22/17 2116      Single Leg Stance   Comments  R Grade 4/5 12 reps, L 15  reps       Strength   Overall Strength Comments  R hip abd 4+/5, hip ext B 4-/5, R hamstring in prone 3+/5R, L 4/5   R eversion/ DF ( 3+/5, B PF/ inv B 4-/5 ) post Tx:5/5       Palpation   Spinal mobility  no deviation at L1-2     SI assessment   levelled pelvis/ iliac crest                    [facility] Adult PT Treatment/Exercise - 12/22/17 2116      Neuro Re-ed    Neuro Re-ed Details   see pt instructions      Manual Therapy   Manual therapy comments  PA/AP mobs, distraction to mobilize midfoot joints to faciliate DF/ eversion B              PT Education - 12/22/17 2117    Education Details  HEP    Person(s) Educated  Patient    Methods  Explanation;Demonstration;Tactile cues;Verbal cues;Handout  Comprehension  Returned demonstration;Verbalized understanding          PT Long Term Goals - 12/14/17 2256      PT LONG TERM GOAL #1   Title  Pt will demo increased hip strength flexion, abduction, knee extension B 4/5 in order to progress to loaded, upright  exercises to return to fitness routines    Time  4    Period  Weeks    Status  New    Target Date  01/11/18      PT LONG TERM GOAL #2   Title  Pt will demo decreased R paraspinal/ QL/ pelvic floor tensions and tenderness with less R deviation of lumbar segments at L1-2 across 2 visits in order to restore proper sitting posture and to return to horseback riding     Time  3    Period  Weeks    Status  New    Target Date  01/04/18      PT LONG TERM GOAL #3   Title  Pt will demo IND with HEP, fitness exercises with weights/ resistance bands to minimize injuries,  and work modifications with sitting, turning at her work station , and lifting her suitcase with proper mechanics     Time  6    Period  Weeks    Status  New    Target Date  01/25/18      PT LONG TERM GOAL #4   Title  Pt will decrease her PGQ score from % to <% in order to have less pain with sit to stand, lifting     Time  6    Period   Weeks    Status  New    Target Date  01/25/18            Plan - 12/22/17 2117    Clinical Impression Statement  Pt's improvements this week include  increased R hip abduction strength, R decreased paraspinal mm tensions, equal alignment of pelvis, and no deviation of lumbar segments   this week. Pt showed decreased PF strength and is progressing today towards lower kinetic chain strengthening. Pt tolerated manual Tx which addressed deficit with DF/ eversion and midfoot mobility. Post Tx, pt showed increased DF and less rigid foot 2/2 high arches and improved eccentric control of posterior chain and gait mechanics. Progressed to low grade,  upright closed chain exercises to strengthen lower chain. Advised pt withhold walking on uneven ground ( nature trails) as pt still needs more lower chain strengthening, ankle / feet mobility and dynamic balance training. Pt voiced understanding. Pt continues to benefit from skilled PT.      Rehab Potential  Good    PT Frequency  2x / week    PT Duration  6 weeks    PT Treatment/Interventions  Therapeutic exercise;Therapeutic activities;Patient/family education;Balance training;Gait training;Moist Heat;Traction;Stair training;Neuromuscular re-education;Manual techniques;Scar mobilization    Consulted and Agree with Plan of Care  Patient       Patient will benefit from skilled therapeutic intervention in order to improve the following deficits and impairments:  Improper body mechanics, Pain, Decreased mobility, Decreased coordination, Decreased scar mobility, Increased muscle spasms, Postural dysfunction, Decreased endurance, Decreased activity tolerance, Decreased balance, Decreased strength, Decreased range of motion, Hypomobility  Visit Diagnosis: Myalgia  Sacrococcygeal disorders, not elsewhere classified  Muscle weakness (generalized)     Problem List There are no active problems to display for this patient.   [name] ,PT, DPT,  E-RYT  12/22/2017, 9:24 PM  [place] MAIN [facility] SERVICES [address] [place], [place], [address] Phone: [phone]   Fax:  [phone]  Name: MARGERY SZOSTAK MRN: [id number] Date of Birth: Apr 26, 1971

## 2017-12-27 ENCOUNTER — Ambulatory Visit: Payer: 59 | Admitting: Physical Therapy

## 2017-12-27 ENCOUNTER — Encounter: Payer: Self-pay | Admitting: Physical Therapy

## 2017-12-27 DIAGNOSIS — M6281 Muscle weakness (generalized): Secondary | ICD-10-CM | POA: Diagnosis not present

## 2017-12-27 DIAGNOSIS — M4126 Other idiopathic scoliosis, lumbar region: Secondary | ICD-10-CM

## 2017-12-27 DIAGNOSIS — M533 Sacrococcygeal disorders, not elsewhere classified: Secondary | ICD-10-CM

## 2017-12-27 DIAGNOSIS — M791 Myalgia, unspecified site: Secondary | ICD-10-CM | POA: Diagnosis not present

## 2017-12-27 NOTE — Therapy (Signed)
[place] MAIN [facility] SERVICES [address] [place], [place], [address] Phone: [phone]   Fax:  [phone]  Physical Therapy Treatment  Patient Details  Name: Sonya Hart MRN: [id number] Date of Birth: [birth year] Referring Provider: Harlow Mares   Encounter Date: 12/27/2017  PT End of Session - 12/27/17 0915    Visit Number  4    Number of Visits  12    Date for PT Re-Evaluation  03/07/18    PT Start Time  0810    PT Stop Time  0910    PT Time Calculation (min)  60 min       Past Medical History:  Diagnosis Date  . Graves disease    radiation to thyroid 1992    No past surgical history on file.  There were no vitals filed for this visit.  Subjective Assessment - 12/27/17 1308    Subjective  Pt reported no complaints from last session and new exercises. Pt notices her pelvis is not strong yet. She likes to give her R side a rest when she comes home and sits on her couch to complete charts. Pt feels emotional and mentally tired with work. Pt finds meditation and getting out in nature to help her with workload stress. Before the Fx , on the weekends, she walks a trail with her dogs and horseriding were ways for stress-relief. Pt plans activities during the week for stress-relief. Pt sleeps for 6 hrs on average per night. Last week, she had one night with poor quality of sleep with her period coming on.          Pertinent History  lifting restrictions < 10 lbs.  prior to injury : horseback riding 3-4x / week, 30 min treadmill 2-3x week, weight lifting dumbbells UE Sat-Sun  , yoga ( video, 1 hour on both and Sat and Sun)   . currently: this weekend, walked .25 mile x 2 with rest break with dog and a walker  ( 7 weeks post injury)  .  Pt is no longer using her walker at all now. Pt was working 3 x day a week 8 hours at a time  which was her work srestrictions and also no lifting > 10 lbs. Pt is ready to increase work to 5 days a week and will be  goin g to see her MD after today's visit to discuss increasing her work frequency.  MD order notes: Pubic rami Fx, weight bearing as tolerated, gentle strengthening, ROM      Patient Stated Goals  get back riding and normal gym workout.          [facility] PT Assessment - 12/27/17 1300      Observation/Other Assessments   Observations  poor alignment,stability, increased lumbar lordosis, hyperextended elbows  in quadriped exercises. perturbation of lumbopelvis in bird dog ( modified with close kinetic chain )                    [facility] Adult PT Treatment/Exercise - 12/27/17 1304      Therapeutic Activites    Therapeutic Activities  -- discussed use of restroative yoga for relaxation next apt      Neuro Re-ed    Neuro Re-ed Details   see pt instructions             PT Education - 12/27/17 1304    Education Details  HEP    Person(s) Educated  Patient    Methods  Explanation;Demonstration;Tactile cues;Verbal cues;Handout    Comprehension  Returned demonstration;Verbalized understanding          PT Long Term Goals - 12/14/17 2256      PT LONG TERM GOAL #1   Title  Pt will demo increased hip strength flexion, abduction, knee extension B 4/5 in order to progress to loaded, upright  exercises to return to fitness routines    Time  4    Period  Weeks    Status  New    Target Date  01/11/18      PT LONG TERM GOAL #2   Title  Pt will demo decreased R paraspinal/ QL/ pelvic floor tensions and tenderness with less R deviation of lumbar segments at L1-2 across 2 visits in order to restore proper sitting posture and to return to horseback riding     Time  3    Period  Weeks    Status  New    Target Date  01/04/18      PT LONG TERM GOAL #3   Title  Pt will demo IND with HEP, fitness exercises with weights/ resistance bands to minimize injuries,  and work modifications with sitting, turning at her work station , and lifting her suitcase with proper mechanics     Time  6     Period  Weeks    Status  New    Target Date  01/25/18      PT LONG TERM GOAL #4   Title  Pt will decrease her PGQ score from % to <% in order to have less pain with sit to stand, lifting     Time  6    Period  Weeks    Status  New    Target Date  01/25/18            Plan - 12/27/17 1305    Clinical Impression Statement  Pt is progressing to closed chain exercises on the floor with yoga stretches and core stabililization and glut strengthening exercises today. Pt required moderate cues for stability and alignment.  Bird dog exercise was down graded due to instability.  All exercises were focused on pelvic girdle stability.  Lunges/ half kneeling positions were withheld for later stages of rehab. Discussed the use of restorative yoga in upcoming sessions to implement stress-relieve to promote healing.  Pt continues to benefit from skilled PT      Rehab Potential  Good    PT Frequency  2x / week    PT Duration  6 weeks    PT Treatment/Interventions  Therapeutic exercise;Therapeutic activities;Patient/family education;Balance training;Gait training;Moist Heat;Traction;Stair training;Neuromuscular re-education;Manual techniques;Scar mobilization    Consulted and Agree with Plan of Care  Patient       Patient will benefit from skilled therapeutic intervention in order to improve the following deficits and impairments:  Improper body mechanics, Pain, Decreased mobility, Decreased coordination, Decreased scar mobility, Increased muscle spasms, Postural dysfunction, Decreased endurance, Decreased activity tolerance, Decreased balance, Decreased strength, Decreased range of motion, Hypomobility  Visit Diagnosis: Myalgia  Sacrococcygeal disorders, not elsewhere classified  Muscle weakness (generalized)  Other idiopathic scoliosis, lumbar region     Problem List There are no active problems to display for this patient.   [name] ,PT, DPT, E-RYT  12/27/2017, 1:08  PM  [place] MAIN [facility] SERVICES [address] [place], [place], [address] Phone: [phone]   Fax:  [phone]  Name: Sonya Hart MRN: [id number] Date of Birth:  05/12/1971   

## 2017-12-27 NOTE — Patient Instructions (Addendum)
Strength poses: Reset in stable positions ( points of contact)   Bird dog modified with leg of extended knee, on the floor, toes tucked, heel back . Opp foot with toes tucked too~ Shoulders engaged at armpit muscles,  Opposite arm in a half "v"  10 reps each leg       Modified dolphin / multifidis Propped on elbows which are placed by ribs to hug armpit muscles  Lift thigh up slightly, knees slightly bent, as if lifting pile of books on feet, ankle 90 deg  10 reps each leg      Modified locust pose / "swimmers" On belly,  Pressing onto pubic symphysis  Arms straight, "shooting laser beams through fingertips, shoudlers away from ears"  Chest lift slightly, chin tucked, do not over arch the back  Extend long leg at a time, toes untucked, pressing tops of feet onto rolled towel , spreading toes  10 reps each   _________ Stretches inbetween strengthening exercises:  Downward facing dog --> walk dog ( bend one knee and then calf stretch)  Starting position  Table top with palms engaged with armpit muscles , knees and shoudlers hip width apart  _childs pose rocking _cat cow  _childs pose - side stretch to each side  _crawl up to double kneeling, arms overstretch then to midline

## 2017-12-27 NOTE — Therapy (Deleted)
[place] MAIN [facility] SERVICES [address] [place], [place], [address] Phone: [phone]   Fax:  [phone]  Physical Therapy Treatment  Patient Details  Name: Sonya Hart MRN: [id number] Date of Birth: 04/28/1971 Referring Provider: Harlow Mares   Encounter Date: 12/27/2017  PT End of Session - 12/27/17 0915    Visit Number  4    Number of Visits  12    Date for PT Re-Evaluation  03/07/18    PT Start Time  0810    PT Stop Time  0910    PT Time Calculation (min)  60 min       Past Medical History:  Diagnosis Date  . Graves disease    radiation to thyroid 1992    No past surgical history on file.  There were no vitals filed for this visit.      [facility] PT Assessment - 12/27/17 1300      Observation/Other Assessments   Observations  poor alignment,stability, increased lumbar lordosis, hyperextended elbows  in quadriped exercises. perturbation of lumbopelvis in bird dog ( modified with close kinetic chain )                    [facility] Adult PT Treatment/Exercise - 12/27/17 1304      Therapeutic Activites    Therapeutic Activities  -- discussed use of restroative yoga for relaxation next apt      Neuro Re-ed    Neuro Re-ed Details   see pt instructions             PT Education - 12/27/17 1304    Education Details  HEP    Person(s) Educated  Patient    Methods  Explanation;Demonstration;Tactile cues;Verbal cues;Handout    Comprehension  Returned demonstration;Verbalized understanding          PT Long Term Goals - 12/14/17 2256      PT LONG TERM GOAL #1   Title  Pt will demo increased hip strength flexion, abduction, knee extension B 4/5 in order to progress to loaded, upright  exercises to return to fitness routines    Time  4    Period  Weeks    Status  New    Target Date  01/11/18      PT LONG TERM GOAL #2   Title  Pt will demo decreased R paraspinal/ QL/ pelvic floor tensions and tenderness  with less R deviation of lumbar segments at L1-2 across 2 visits in order to restore proper sitting posture and to return to horseback riding     Time  3    Period  Weeks    Status  New    Target Date  01/04/18      PT LONG TERM GOAL #3   Title  Pt will demo IND with HEP, fitness exercises with weights/ resistance bands to minimize injuries,  and work modifications with sitting, turning at her work station , and lifting her suitcase with proper mechanics     Time  6    Period  Weeks    Status  New    Target Date  01/25/18      PT LONG TERM GOAL #4   Title  Pt will decrease her PGQ score from % to <% in order to have less pain with sit to stand, lifting     Time  6    Period  Weeks    Status  New    Target Date  01/25/18            Plan - 12/27/17 1305    Clinical Impression Statement  Pt is progressing to closed chain exercises on the floor with yoga stretches and core stabililization and glut strengthenign exercises today. Pt required moderate cues for stability and alignment.  Bird dog exercise was down graded due to instability.  All exercsies were focused on pelvic girdle stability.  Lunges/ half kneeling positions were withheld for later stages of rehab. Pt continues to benefit from skilled PT      Rehab Potential  Good    PT Frequency  2x / week    PT Duration  6 weeks    PT Treatment/Interventions  Therapeutic exercise;Therapeutic activities;Patient/family education;Balance training;Gait training;Moist Heat;Traction;Stair training;Neuromuscular re-education;Manual techniques;Scar mobilization    Consulted and Agree with Plan of Care  Patient       Patient will benefit from skilled therapeutic intervention in order to improve the following deficits and impairments:  Improper body mechanics, Pain, Decreased mobility, Decreased coordination, Decreased scar mobility, Increased muscle spasms, Postural dysfunction, Decreased endurance, Decreased activity tolerance, Decreased  balance, Decreased strength, Decreased range of motion, Hypomobility  Visit Diagnosis: Myalgia  Sacrococcygeal disorders, not elsewhere classified  Muscle weakness (generalized)  Other idiopathic scoliosis, lumbar region     Problem List There are no active problems to display for this patient.   Sonya Hart 12/27/2017, 1:07 PM  [place] MAIN [facility] SERVICES [address] [place], [place], [address] Phone: [phone]   Fax:  [phone]  Name: Sonya Hart MRN: [id number] Date of Birth: 04/13/1971

## 2017-12-29 ENCOUNTER — Ambulatory Visit: Payer: 59 | Admitting: Physical Therapy

## 2017-12-29 DIAGNOSIS — M4126 Other idiopathic scoliosis, lumbar region: Secondary | ICD-10-CM

## 2017-12-29 DIAGNOSIS — M6281 Muscle weakness (generalized): Secondary | ICD-10-CM

## 2017-12-29 DIAGNOSIS — M791 Myalgia, unspecified site: Secondary | ICD-10-CM | POA: Diagnosis not present

## 2017-12-29 DIAGNOSIS — M533 Sacrococcygeal disorders, not elsewhere classified: Secondary | ICD-10-CM

## 2017-12-29 NOTE — Patient Instructions (Addendum)
Pool therapy: 30 min  ( Saturday; walk on track 1/2 mile only, do this instead)    Warm -up/ cool down stretches  leg circles  figure-4 ( piriformis stretch)  childs pose by edge ( standing) -3 way   quad  Hamstring   calf   ____  2 laps walking, thigh highs, feet attention ( noodle,  Antenneas, elbows by side)  2 laps backwards, lower feet slowly  2 laps left/ right, side step with mini sqat  ( progress with noodle behind  10 reps of tricep. Lat with noodle pushing down ( thumbs out)-> mini squat  ___   Warm -up/ cool down stretches  leg circles  figure-4 ( piriformis stretch)  childs pose by edge ( standing) -3 way   quad  Hamstring   calf    Corpse:   Relaxation on 4 noodles under head, armpits, midback, posterior thighs  _____  Stretching pelvic floor scar, increasing hip mobility  modification ( Bharadvaja's Twist)  Both ankles to the R of R hip, L hand is on the floor by L side, rock 11 to 5 o clock from front L knee to back R buttocks  5 -10 reps x    Sidelying ( clam shell position) head relaxed. _Quad stretch, pulling ankle back to bring tight back while bottom thigh/ ankle presses against floor 5 breaths   _sidelying tree pose (adductor stretch), pressing top shin bone perpendicular while bottom thigh/ ankle presses against floor  5 breaths   Strengthening -> Prep for side plank modified  Prep for side plank modified  _spider woman hand -> wrist ahead of shoulder, side body lengthened  _tree pose position , pressing top shin bone perpendicular while bottom thigh/ ankle presses against floor Then lift hips up  POINTs of contact: palms, tree pose foot on ground, bottom edge of bottom ankle/ foot  3 reps    ________  Stretches at hip socket Stretches for your legs: LAYING on Back Use upper arms and elbows for stability when pulling strap _figure -4  -cross thighs over  Strap on ballmound: _strap, L knee bent, R ballmound against strap and  spread toes, rolling foot 15 deg out and in across midline.  10 reps each side  _knee bends 10 reps    ____  Restorative pose to stretch back and hamstrings while rejuvenate body Legs propped against wall, knees slightly bent  Pillow under hips and mid back to elevate hips higher than heart  Towel over eyes, rolled hand cloth under neck   5-10 min  END WITH CORPSE POSE  10 min

## 2017-12-30 NOTE — Therapy (Signed)
[place] MAIN [facility] SERVICES [address] [place], [place], [address] Phone: [phone]   Fax:  [phone]  Physical Therapy Treatment  Patient Details  Name: Sonya Hart MRN: [id number] Date of Birth: 11-18-1970 Referring Provider: Harlow Mares   Encounter Date: 12/29/2017  PT End of Session - 12/29/17 0916    Visit Number  5    Number of Visits  12    Date for PT Re-Evaluation  03/07/18    PT Start Time  0810    PT Stop Time  0910    PT Time Calculation (min)  60 min    Activity Tolerance  Patient tolerated treatment well;No increased pain    Behavior During Therapy  WFL for tasks assessed/performed       Past Medical History:  Diagnosis Date  . Graves disease    radiation to thyroid 1992    No past surgical history on file.  There were no vitals filed for this visit.  Subjective Assessment - 12/29/17 0816    Subjective  Pt noticed her core mm with the new HEP.     Pertinent History  lifting restrictions < 10 lbs.  prior to injury : horseback riding 3-4x / week, 30 min treadmill 2-3x week, weight lifting dumbbells UE Sat-Sun  , yoga ( video, 1 hour on both and Sat and Sun)   . currently: this weekend, walked .25 mile x 2 with rest break with dog and a walker  ( 7 weeks post injury)  .  Pt is no longer using her walker at all now. Pt was working 3 x day a week 8 hours at a time  which was her work srestrictions and also no lifting > 10 lbs. Pt is ready to increase work to 5 days a week and will be goin g to see her MD after today's visit to discuss increasing her work frequency.  MD order notes: Pubic rami Fx, weight bearing as tolerated, gentle strengthening, ROM      Patient Stated Goals  get back riding and normal gym workout.          [facility] PT Assessment - 12/29/17 0917      Observation/Other Assessments   Observations  limitedquad flexibility on R       Side plank on R: required modification with more points of contact  due to weakness   Tolerated progression of clam, scapular stabilization with less cues             [facility] Adult PT Treatment/Exercise - 12/29/17 0917      Neuro Re-ed    Neuro Re-ed Details   see pt instructions             PT Education - 12/29/17 0916    Education Details  HEP    Person(s) Educated  Patient    Methods  Explanation;Demonstration;Tactile cues;Verbal cues;Handout    Comprehension  Returned demonstration;Verbalized understanding          PT Long Term Goals - 12/14/17 2256      PT LONG TERM GOAL #1   Title  Pt will demo increased hip strength flexion, abduction, knee extension B 4/5 in order to progress to loaded, upright  exercises to return to fitness routines    Time  4    Period  Weeks    Status  New    Target Date  01/11/18      PT LONG TERM GOAL #2   Title  Pt will demo decreased R paraspinal/ QL/ pelvic floor tensions and tenderness with less R deviation of lumbar segments at L1-2 across 2 visits in order to restore proper sitting posture and to return to horseback riding     Time  3    Period  Weeks    Status  New    Target Date  01/04/18      PT LONG TERM GOAL #3   Title  Pt will demo IND with HEP, fitness exercises with weights/ resistance bands to minimize injuries,  and work modifications with sitting, turning at her work station , and lifting her suitcase with proper mechanics     Time  6    Period  Weeks    Status  New    Target Date  01/25/18      PT LONG TERM GOAL #4   Title  Pt will decrease her PGQ score from % to <% in order to have less pain with sit to stand, lifting     Time  6    Period  Weeks    Status  New    Target Date  01/25/18            Plan - 12/30/17 1126    Clinical Impression Statement  Pt tolerated today's session without complaints. Added specific hip flexibility into HEP and restorative yoga pose to stretch hamstring. Progressed clam shells with scapular stabilization position. Discussed the  use of pool therapy to strengthen LE and minimize land walking by 50% in order to optimize strengthen training this week. Discussed work life balance strategies to promote healing. Pt continues to benefit from skilled PT.      Rehab Potential  Good    PT Frequency  2x / week    PT Duration  6 weeks    PT Treatment/Interventions  Therapeutic exercise;Therapeutic activities;Patient/family education;Balance training;Gait training;Moist Heat;Traction;Stair training;Neuromuscular re-education;Manual techniques;Scar mobilization    Consulted and Agree with Plan of Care  Patient       Patient will benefit from skilled therapeutic intervention in order to improve the following deficits and impairments:  Improper body mechanics, Pain, Decreased mobility, Decreased coordination, Decreased scar mobility, Increased muscle spasms, Postural dysfunction, Decreased endurance, Decreased activity tolerance, Decreased balance, Decreased strength, Decreased range of motion, Hypomobility  Visit Diagnosis: Myalgia  Sacrococcygeal disorders, not elsewhere classified  Muscle weakness (generalized)  Other idiopathic scoliosis, lumbar region     Problem List There are no active problems to display for this patient.   [name] ,PT, DPT, E-RYT  12/30/2017, 11:28 AM  [place] MAIN [facility] SERVICES [address] [place], [place], [address] Phone: [phone]   Fax:  [phone]  Name: Sonya Hart MRN: [id number] Date of Birth: [birth year]

## 2018-01-03 ENCOUNTER — Ambulatory Visit: Payer: 59 | Admitting: Physical Therapy

## 2018-01-03 ENCOUNTER — Encounter: Payer: Self-pay | Admitting: Physical Therapy

## 2018-01-03 DIAGNOSIS — M791 Myalgia, unspecified site: Secondary | ICD-10-CM | POA: Diagnosis not present

## 2018-01-03 DIAGNOSIS — M533 Sacrococcygeal disorders, not elsewhere classified: Secondary | ICD-10-CM

## 2018-01-03 DIAGNOSIS — M4126 Other idiopathic scoliosis, lumbar region: Secondary | ICD-10-CM | POA: Diagnosis not present

## 2018-01-03 DIAGNOSIS — M6281 Muscle weakness (generalized): Secondary | ICD-10-CM

## 2018-01-03 NOTE — Patient Instructions (Signed)
Upgrade birddog to lifting foot off ground, knee straight  Imaginary plate on back to minimize lumbar lordosis   (watch video) _________   Hamstring/ gluts/ thoracolumbar   Bridging series w/ resistive band other side of doorknob:  Level 1:  Position:  Elbows bent, knees hip width apart, heels under knees on top of stable  foot stool   Stabilization points: shoulders, upper arms, back of head pressed into floor. Heel press downward.   Movement: inhale do nothing, exhale pull band by side, lower fists to floor completely while lifting hips.Keep stabilization points engaged when you allow the band to go back to starting position  10 x 2 reps       Level 2:  Position:  Elbows straight, arms raised to ceiling at shoulder height, knees apart like a ballerina,heels together, heels under knees, on top of stable  foot stool   Stabilization points: shoulders, upper arms, back of head pressed into floor. Heel press downward.   Movement: inhale do nothing, exhale pull band by side, lower fists to floor completely while lifting hips. Keep stabilization points engaged when you allow the band to go back to starting position   10 x 2 reps  Shoulder training: Try to imagine you are squeezing a pencil under your armpit and your shoulder blades are down away from your ears and towards each other    ____________  Restorative Yoga pose  Sidelying with pillows propped under top thigh/ foot, bottom thigh pulled back slightly into extension, knee bent comfortably,  Head on top of double folded pillow, small pillow under top arm , pillow behind back to lean back on with very slight rotation

## 2018-01-03 NOTE — Therapy (Signed)
[place] MAIN [facility] SERVICES [address] [place], [place], [address] Phone: [phone]   Fax:  [phone]  Physical Therapy Treatment  Patient Details  Name: SOFIYA EZELLE MRN: [id number] Date of Birth: 07-Dec-1970 Referring Provider: Harlow Mares   Encounter Date: 01/03/2018  PT End of Session - 01/03/18 0855    Visit Number  6    Number of Visits  12    Date for PT Re-Evaluation  03/07/18    PT Start Time  0804    PT Stop Time  0905    PT Time Calculation (min)  61 min    Activity Tolerance  Patient tolerated treatment well;No increased pain    Behavior During Therapy  WFL for tasks assessed/performed       Past Medical History:  Diagnosis Date  . Graves disease    radiation to thyroid 1992    No past surgical history on file.  There were no vitals filed for this visit.  Subjective Assessment - 01/03/18 0810    Subjective  Pt feels normal after working but not normal with her physical activities. Pt does not feel like she can be walking up and down hills, uneven terrain or get on her treadmill, do yoga.  The stretches from last session helps with her tightness in her glut area.     Pertinent History  lifting restrictions < 10 lbs.  prior to injury : horseback riding 3-4x / week, 30 min treadmill 2-3x week, weight lifting dumbbells UE Sat-Sun  , yoga ( video, 1 hour on both and Sat and Sun)   . currently: this weekend, walked .25 mile x 2 with rest break with dog and a walker  ( 7 weeks post injury)  .  Pt is no longer using her walker at all now. Pt was working 3 x day a week 8 hours at a time  which was her work srestrictions and also no lifting > 10 lbs. Pt is ready to increase work to 5 days a week and will be goin g to see her MD after today's visit to discuss increasing her work frequency.  MD order notes: Pubic rami Fx, weight bearing as tolerated, gentle strengthening, ROM      Patient Stated Goals  get back riding and normal gym  workout.          [facility] PT Assessment - 01/03/18 0819      Other:   Other/ Comments  birddog with minor cues for less lumbar lordosis w/ hip ext 0 deg, foot off floor .  New bridging exercise w/ cue for less tension on band to  minmize challenge       Strength   Overall Strength Comments  hip abd R 4/5, L 4-/5   , hip ext L 3+/5, R 4/5,  hamstring 3+/5 B,  hip ext L 4-/5, 4/5,  posterior sling Thoraclumbar/ glut: 4/5                     [facility] Adult PT Treatment/Exercise - 01/03/18 8250      Neuro Re-ed    Neuro Re-ed Details   see pt instructions             PT Education - 01/03/18 0855    Education Details  HEP    Person(s) Educated  Patient    Methods  Explanation;Demonstration;Verbal cues;Handout;Tactile cues    Comprehension  Verbalized understanding;Returned demonstration;Verbal cues required  PT Long Term Goals - 12/14/17 2256      PT LONG TERM GOAL #1   Title  Pt will demo increased hip strength flexion, abduction, knee extension B 4/5 in order to progress to loaded, upright  exercises to return to fitness routines    Time  4    Period  Weeks    Status  New    Target Date  01/11/18      PT LONG TERM GOAL #2   Title  Pt will demo decreased R paraspinal/ QL/ pelvic floor tensions and tenderness with less R deviation of lumbar segments at L1-2 across 2 visits in order to restore proper sitting posture and to return to horseback riding     Time  3    Period  Weeks    Status  New    Target Date  01/04/18      PT LONG TERM GOAL #3   Title  Pt will demo IND with HEP, fitness exercises with weights/ resistance bands to minimize injuries,  and work modifications with sitting, turning at her work station , and lifting her suitcase with proper mechanics     Time  6    Period  Weeks    Status  New    Target Date  01/25/18      PT LONG TERM GOAL #4   Title  Pt will decrease her PGQ score from % to <% in order to have less pain with sit to  stand, lifting     Time  6    Period  Weeks    Status  New    Target Date  01/25/18            Plan - 01/03/18 0855    Clinical Impression Statement  Pt showed significantly improvement with her R LE strength and deep core strength today. Progessed pt to resistance band exercises for BUE in bridging positions. Pt tolerated without pain complaints. Continued to incorporate relaxation into HEP. New restorative pose selected for passive quad stretching.  Pt continues to benefit from skilled PT. Plan to perform dynamic stability training on RLE next session    Rehab Potential  Good    PT Frequency  2x / week    PT Duration  6 weeks    PT Treatment/Interventions  Therapeutic exercise;Therapeutic activities;Patient/family education;Balance training;Gait training;Moist Heat;Traction;Stair training;Neuromuscular re-education;Manual techniques;Scar mobilization    Consulted and Agree with Plan of Care  Patient       Patient will benefit from skilled therapeutic intervention in order to improve the following deficits and impairments:  Improper body mechanics, Pain, Decreased mobility, Decreased coordination, Decreased scar mobility, Increased muscle spasms, Postural dysfunction, Decreased endurance, Decreased activity tolerance, Decreased balance, Decreased strength, Decreased range of motion, Hypomobility  Visit Diagnosis: Myalgia  Sacrococcygeal disorders, not elsewhere classified  Muscle weakness (generalized)     Problem List There are no active problems to display for this patient.   [name] ,PT, DPT, E-RYT  01/03/2018, 9:23 AM  [place] MAIN [facility] SERVICES [address] [place], [place], [address] Phone: [phone]   Fax:  [phone]  Name: JAYDEN RUDGE MRN: [id number] Date of Birth: 1971-02-20

## 2018-01-05 ENCOUNTER — Ambulatory Visit: Payer: 59 | Admitting: Physical Therapy

## 2018-01-05 DIAGNOSIS — M533 Sacrococcygeal disorders, not elsewhere classified: Secondary | ICD-10-CM

## 2018-01-05 DIAGNOSIS — M6281 Muscle weakness (generalized): Secondary | ICD-10-CM

## 2018-01-05 DIAGNOSIS — M791 Myalgia, unspecified site: Secondary | ICD-10-CM

## 2018-01-05 DIAGNOSIS — M4126 Other idiopathic scoliosis, lumbar region: Secondary | ICD-10-CM

## 2018-01-05 NOTE — Patient Instructions (Signed)
Standing series at home:  Chair pose  ( mini squat, feeet hip width apart)  Added twist opp arm on thigh without shifting knees and hips    Warrior II:  Raise arm up in front on the same side as your front knee, back arm up behind. Arms are aligned with the length of the mat Inhale,  Exhale and relax shoulders and ribcage  Make sure 50% weight is in the front foot/leg , 50% weight is the back foot/ leg   3 breaths here.   Extended side angle :  Feet are hip width apart, L foot one behind like you are on ski tracks,  R knee bent over ankle but not more forward then the ankle.  Make sure 50% weight is in the front foot/leg , 50% weight is the back foot/ leg    Rest R forearm lightly on top of thigh,  L hand on L hip.  Inhale lengthen spine,   Exhale turn navel to the L then the ribcage turns, look at the other wall.  Keep maintaining  50% weight is in the front foot/leg , 50% weight is the back foot/ leg  And make sure the front knee is still pointed in the toe line of the 2nd toe.   3 breaths here.   Triangle pose Front knee straight but not locked( same in back knee-not locked) Block by outside of front leg,  Reach forward to lengthen spine, then lower bottom hand onto block with finger weight suppport ( never shifting hip )   Top arm straight to ceiling, then elbow circles backward   Warrior III:      Unlock knee of standing leg ,  Arms by side like in mountain pose Foot pointed perpendicular to ground, hips squared to floor,   Back foot touching floor for support  3 breaths here.   _______  At work:  30 sec   tree pose ( pressing standing leg into foot again above ankle level while foot pressing against standing leg)  30 sec    eagle pose   Sit in a chair position, feet together  Cross R thigh over L, press them strongly against each other  Single arm on wall for this week   Stay and breath 3-5 breaths  Sitting a little deeper    Double heel  raises  10 x 3 sets x 3 day

## 2018-01-05 NOTE — Therapy (Signed)
[place] MAIN [facility] SERVICES [address] [place], [place], [address] Phone: [phone]   Fax:  [phone]  Physical Therapy Treatment  Patient Details  Name: Sonya Hart MRN: [id number] Date of Birth: June 19, 1971 Referring Provider: Harlow Mares   Encounter Date: 01/05/2018  PT End of Session - 01/05/18 0908    Visit Number  7    Number of Visits  12    Date for PT Re-Evaluation  03/07/18    PT Start Time  0908    PT Stop Time  1010    PT Time Calculation (min)  62 min    Activity Tolerance  Patient tolerated treatment well;No increased pain    Behavior During Therapy  WFL for tasks assessed/performed       Past Medical History:  Diagnosis Date  . Graves disease    radiation to thyroid 1992    No past surgical history on file.  There were no vitals filed for this visit.  Subjective Assessment - 01/05/18 0908    Subjective  Pt feel the R glut area every once in a while.          [facility] PT Assessment - 01/05/18 0956      Other:   Other/ Comments  minor cues for knee alignment in warrior II pose. Modifid eagle pose with LUE on wall for more support and less strain on hip/RLE.        Strength   Overall Strength Comments  plantarflexion , single heel raises on RSLE with pelvic rotation (compensation) with LUE on wall. Withheld from single heel raises. ADded double heel raises into HEP instead                    [facility] Adult PT Treatment/Exercise - 01/05/18 9470      Neuro Re-ed    Neuro Re-ed Details   see pt instructions             PT Education - 01/05/18 1055    Education Details  HEP    Person(s) Educated  Patient    Methods  Explanation;Demonstration;Tactile cues;Verbal cues;Handout    Comprehension  Returned demonstration;Verbalized understanding          PT Long Term Goals - 12/14/17 2256      PT LONG TERM GOAL #1   Title  Pt will demo increased hip strength flexion, abduction, knee  extension B 4/5 in order to progress to loaded, upright  exercises to return to fitness routines    Time  4    Period  Weeks    Status  New    Target Date  01/11/18      PT LONG TERM GOAL #2   Title  Pt will demo decreased R paraspinal/ QL/ pelvic floor tensions and tenderness with less R deviation of lumbar segments at L1-2 across 2 visits in order to restore proper sitting posture and to return to horseback riding     Time  3    Period  Weeks    Status  New    Target Date  01/04/18      PT LONG TERM GOAL #3   Title  Pt will demo IND with HEP, fitness exercises with weights/ resistance bands to minimize injuries,  and work modifications with sitting, turning at her work station , and lifting her suitcase with proper mechanics     Time  6    Period  Weeks    Status  New    Target Date  01/25/18      PT LONG TERM GOAL #4   Title  Pt will decrease her PGQ score from % to <% in order to have less pain with sit to stand, lifting     Time  6    Period  Weeks    Status  New    Target Date  01/25/18            Plan - 01/05/18 1055    Clinical Impression Statement  Pt progressed to standing yoga poses with emphasis on modifications to minimize risk for injuries and to optimize pelvic girdle/ lower kinetic stability.. Pt initially showed poor alignment and poor technique but demo'd correctly after practicing a series of postures across 3 sets.  Video was recorded on pt's phone with cues in order to yield better carry-over. Pt showed weakness and compensatory strategy with single heel raises and therefore, pt was down graded to double heel raises.  Static balance exercises were performed with single UE on wall to promote more stability and safe healign at the area of Fx.  Pt was advised she may walk on her nature trail this weekend and she can follow the yoga and core HEP that have been customized for her . Pt to not practice yoga from [employer]  Pt voiced understanding. Plan to  continue strengthening and continue progressing  to dynamic balance training  next week with assessment on stair climbing.  Decreasing frequency from 2 x week to 1 x week as pt is improving.  Pt continues to benefit from skilled PT.     Rehab Potential  Good    PT Frequency  2x / week    PT Duration  6 weeks    PT Treatment/Interventions  Therapeutic exercise;Therapeutic activities;Patient/family education;Balance training;Gait training;Moist Heat;Traction;Stair training;Neuromuscular re-education;Manual techniques;Scar mobilization    Consulted and Agree with Plan of Care  Patient       Patient will benefit from skilled therapeutic intervention in order to improve the following deficits and impairments:  Improper body mechanics, Pain, Decreased mobility, Decreased coordination, Decreased scar mobility, Increased muscle spasms, Postural dysfunction, Decreased endurance, Decreased activity tolerance, Decreased balance, Decreased strength, Decreased range of motion, Hypomobility  Visit Diagnosis: Myalgia  Sacrococcygeal disorders, not elsewhere classified  Muscle weakness (generalized)  Other idiopathic scoliosis, lumbar region     Problem List There are no active problems to display for this patient.   [name] ,PT, DPT, E-RYT  01/05/2018, 11:01 AM  [place] MAIN [facility] SERVICES [address] [place], [place], [address] Phone: [phone]   Fax:  [phone]  Name: Sonya Hart MRN: [id number] Date of Birth: 1971/01/14

## 2018-01-10 ENCOUNTER — Ambulatory Visit: Payer: 59 | Attending: Endocrinology | Admitting: Physical Therapy

## 2018-01-10 DIAGNOSIS — M791 Myalgia, unspecified site: Secondary | ICD-10-CM | POA: Insufficient documentation

## 2018-01-10 DIAGNOSIS — M4126 Other idiopathic scoliosis, lumbar region: Secondary | ICD-10-CM | POA: Diagnosis not present

## 2018-01-10 DIAGNOSIS — M6281 Muscle weakness (generalized): Secondary | ICD-10-CM | POA: Diagnosis not present

## 2018-01-10 DIAGNOSIS — M533 Sacrococcygeal disorders, not elsewhere classified: Secondary | ICD-10-CM | POA: Insufficient documentation

## 2018-01-10 NOTE — Patient Instructions (Addendum)
  Stretch for pelvic floor  Laying on your back  Knees bent feet on bed/floor  "v heels slide away and then back toward buttocks and then rock knee to slight , slide heel along at 11 o clock away from buttocks  10 reps  _____   tree pose with sole gliding along inside calf and thigh of extended knee Pillow under bent knee of leg that slides to allow for thigh to relax   __________  To minimize overactivity of pelvic floor   In standing yoga poses: Modify with figure -4 stretch sitting and discontinue the one standing in mini squat Do not push knee down, lean forward to feel stretch in back buttocks   In warrior, extended side angle, triangle: Make sure you have 40% weight in front leg, 60% on back leg and do not put a lot of weight on front thigh, just lightly leaning arm onto it  ____________  Start to do clam shells on L leg as well  ____________  Walking on the beach:  Walking on the solid wet sand will be nice for 1 mi -2 mi walks. Backward / side stepping walking for some parts of your walk along clear path  will be silly looking but can be helpful for the hamstring Dry sand will be more challenging of a work out for short laps 3-4  ( 10 ft or so)   and have "knee high" to lift leg up for hip flexion strengthening Yoga stretches on solid sand  ENJOY the sunshine and ocean healing!

## 2018-01-10 NOTE — Therapy (Signed)
[place] MAIN [facility] SERVICES [address] [place], [place], [address] Phone: [phone]   Fax:  [phone]  Physical Therapy Treatment  Patient Details  Name: Sonya Hart MRN: [id number] Date of Birth: 1971/02/12 Referring Provider: Harlow Mares   Encounter Date: 01/10/2018  PT End of Session - 01/10/18 0951    Visit Number  8    Number of Visits  12    Date for PT Re-Evaluation  03/07/18    PT Start Time  0805    PT Stop Time  0904    PT Time Calculation (min)  59 min    Activity Tolerance  Patient tolerated treatment well;No increased pain    Behavior During Therapy  WFL for tasks assessed/performed       Past Medical History:  Diagnosis Date  . Graves disease    radiation to thyroid 1992    No past surgical history on file.  There were no vitals filed for this visit.  Subjective Assessment - 01/10/18 0808    Subjective  Pt had no pain with last session but at 4am the following morning, pt was awakened by a throbbing pain 8/10 in the rectum. It progressively got better by noon the next day when she was finished working 0/10.  No problems with bowel and bladder. This pain has not returned to that intenisity.           [facility] PT Assessment - 01/10/18 0816      Observation/Other Assessments   Observations  crossing R ankle over L thigh with increased mobility post Tx      Strength   Overall Strength Comments  hip abd R 4+/5, L 4-/5   ,   hamstring 3+/5 R, 4-/5 L ,  hip ext L 4-/5, 4/5,  hip flexion L 4/5, R 4+/5                 Pelvic Floor Special Questions - 01/10/18 0939    Pelvic Floor Internal Exam  pt consent verbally without contraindications    Exam Type  Rectal    Palpation  tensions and tenderness R puborectalis 1-5 o clock along ischial spine/ greater sciatic notch        [facility] Adult PT Treatment/Exercise - 01/10/18 6834      Neuro Re-ed    Neuro Re-ed Details   see pt instructions      Manual  Therapy   Internal Pelvic Floor  Rectal: STM and MWM / R puborectalis 1-5 o clock along ischial spine/ greater sciatic notch             PT Education - 01/10/18 0951    Education Details  HEP    Person(s) Educated  Patient    Methods  Explanation;Demonstration;Tactile cues;Verbal cues;Handout    Comprehension  Returned demonstration;Verbalized understanding          PT Long Term Goals - 01/10/18 0951      PT LONG TERM GOAL #1   Title  Pt will demo increased hip strength flexion, abduction, knee extension B 4/5 in order to progress to loaded, upright  exercises to return to fitness routines    Baseline  01/10/18:  hip abd R 4+/5, L 4-/5   , hamstring 3+/5 R, 4-/5 L ,  hip ext L 4-/5, 4/5,  hip flexion L 4/5, R 4+/5     Time  4    Period  Weeks    Status  Partially Met  PT LONG TERM GOAL #2   Title  Pt will demo decreased R paraspinal/ QL/ pelvic floor tensions and tenderness with less R deviation of lumbar segments at L1-2 across 2 visits in order to restore proper sitting posture and to return to horseback riding     Time  3    Period  Weeks    Status  Achieved      PT LONG TERM GOAL #3   Title  Pt will demo IND with HEP, fitness exercises with weights/ resistance bands to minimize injuries,  and work modifications with sitting, turning at her work station , and lifting her suitcase with proper mechanics     Time  6    Period  Weeks    Status  Partially Met      PT LONG TERM GOAL #4   Title  Pt will decrease her PGQ score 23% to <18% in order to have less pain with sit to stand, lifting     Time  6    Period  Weeks    Status  On-going      PT LONG TERM GOAL #5   Title  Pt will demo proper pelvic floor lengthening and contraction Grade 4/5 for 5 reps, 5 sec in order to increase pelvic girdle stability to progress towards horseback riding     Time  8    Period  Weeks    Status  New    Target Date  03/07/18      Additional Long Term Goals   Additional Long Term  Goals  Yes      PT LONG TERM GOAL #6   Title   PSFS score to decrease from 0/10 for  horseback riding,  yoga, running 0/10    Time  12    Status  On-going    Target Date  04/04/18            Plan - 01/10/18 0958    Clinical Impression Statement  Pt is progressing well towards her goals. Pt has started to walk on uneven surfaces with her dog  for 1 mi this week without issues.  Pt has no more deviations to her spine nor pelvic obliquities for several weeks. Scoliosis/ R sided mm imbalance has been corrected with HEP and work station modifications to [employer] repeated R rotation of trunk.  Pt has showed increased R hip strength since [facility] but will continue to require strengthening for L LE and B hamstrings. Pt has been provided modifications to yoga poses to minmize risk for injury. Pt demo'd correctly. Pt showed pelvic floor tightness on R which decreased today following rectal Tx. This Tx will likely help with minimize the rectal mm spasm she experienced after last session. Yoga poses were downgraded to also minmize rectal mm spasms and it is likely the cause for rectal spasms may have been from performing too many repetitions of yoga poses during last visit. Pt was able to achieve increased glut strength and hip external rotation/ER/ flexion on R with increased mobility following today's sessions. Plan to add pelvic floor strengthening at upcoming visits once pt shows no more mm tensions of pelvic floor mm. Pt is safe to return to yoga routine with customized yoga modifications for strengthening, stability, and flexibility instruction provided by DPT who is also a yoga instructor. Provided more cues for pt today for practice standing yoga poses with proper alignment in BLE to minimize overuse of pelvic floor and to minimize risk for injuries.  Pt demo'd correctly. Pt continues to benefit from skilled PT.         Rehab Potential  Good    PT Frequency  2x / week    PT Duration  6 weeks    PT  Treatment/Interventions  Therapeutic exercise;Therapeutic activities;Patient/family education;Balance training;Gait training;Moist Heat;Traction;Stair training;Neuromuscular re-education;Manual techniques;Scar mobilization    Consulted and Agree with Plan of Care  Patient       Patient will benefit from skilled therapeutic intervention in order to improve the following deficits and impairments:  Improper body mechanics, Pain, Decreased mobility, Decreased coordination, Decreased scar mobility, Increased muscle spasms, Postural dysfunction, Decreased endurance, Decreased activity tolerance, Decreased balance, Decreased strength, Decreased range of motion, Hypomobility  Visit Diagnosis: Sacrococcygeal disorders, not elsewhere classified  Myalgia  Muscle weakness (generalized)  Other idiopathic scoliosis, lumbar region     Problem List There are no active problems to display for this patient.   [name] ,PT, DPT, E-RYT  01/10/2018, 10:08 AM  [place] MAIN [facility] SERVICES [address] [place], [place], [address] Phone: [phone]   Fax:  [phone]  Name: Sonya Hart MRN: [id number] Date of Birth: 1970/09/01

## 2018-01-17 ENCOUNTER — Encounter: Payer: 59 | Admitting: Physical Therapy

## 2018-01-19 ENCOUNTER — Encounter: Payer: 59 | Admitting: Physical Therapy

## 2018-01-23 ENCOUNTER — Ambulatory Visit: Payer: 59 | Admitting: Physical Therapy

## 2018-01-23 DIAGNOSIS — M791 Myalgia, unspecified site: Secondary | ICD-10-CM

## 2018-01-23 DIAGNOSIS — M6281 Muscle weakness (generalized): Secondary | ICD-10-CM | POA: Diagnosis not present

## 2018-01-23 DIAGNOSIS — M533 Sacrococcygeal disorders, not elsewhere classified: Secondary | ICD-10-CM

## 2018-01-23 DIAGNOSIS — M4126 Other idiopathic scoliosis, lumbar region: Secondary | ICD-10-CM | POA: Diagnosis not present

## 2018-01-23 NOTE — Therapy (Signed)
[place] MAIN [facility] SERVICES [address] [place], [place], [address] Phone: [phone]   Fax:  [phone]  Physical Therapy Treatment  Patient Details  Name: Sonya Hart MRN: [id number] Date of Birth: 1971/05/14 Referring Provider: Harlow Mares   Encounter Date: 01/23/2018  PT End of Session - 01/23/18 0812    Visit Number  9    Number of Visits  12    Date for PT Re-Evaluation  03/07/18    PT Start Time  0810    PT Stop Time  0906    PT Time Calculation (min)  56 min    Activity Tolerance  Patient tolerated treatment well;No increased pain    Behavior During Therapy  WFL for tasks assessed/performed       Past Medical History:  Diagnosis Date  . Graves disease    radiation to thyroid 1992    No past surgical history on file.  There were no vitals filed for this visit.  Subjective Assessment - 01/23/18 0811    Subjective  Pt reported she did not do her HEP over her vacation. Pt felt some limping by the end of the week but was able to play her family by water.           [facility] PT Assessment - 01/23/18 0815      Observation/Other Assessments   Observations  stairs: ankle instability on descent, poor eccentric control, narrow BOS        Strength   Overall Strength Comments  hip abd R 5/5, L 4-/5   ,   hamstring 4-/5 R, 4-/5 L ,  hip ext L 4-/5, 5/5,  hip flexion L 4/5, R 5/5       Palpation   Palpation comment  increased tensions ITB on R                    [facility] Adult PT Treatment/Exercise - 01/23/18 8372      Neuro Re-ed    Neuro Re-ed Details   see pt instructions      Exercises   Exercises  -- see pt instructions      Manual Therapy   Manual therapy comments  STM along R ITB              PT Education - 01/23/18 0906    Education Details  HEP    Person(s) Educated  Patient    Methods  Explanation;Demonstration;Tactile cues;Verbal cues;Handout    Comprehension  Returned  demonstration;Verbalized understanding          PT Long Term Goals - 01/10/18 0951      PT LONG TERM GOAL #1   Title  Pt will demo increased hip strength flexion, abduction, knee extension B 4/5 in order to progress to loaded, upright  exercises to return to fitness routines    Baseline  01/10/18:  hip abd R 4+/5, L 4-/5   , hamstring 3+/5 R, 4-/5 L ,  hip ext L 4-/5, 4/5,  hip flexion L 4/5, R 4+/5     Time  4    Period  Weeks    Status  Partially Met      PT LONG TERM GOAL #2   Title  Pt will demo decreased R paraspinal/ QL/ pelvic floor tensions and tenderness with less R deviation of lumbar segments at L1-2 across 2 visits in order to restore proper sitting posture and to return to horseback riding  Time  3    Period  Weeks    Status  Achieved      PT LONG TERM GOAL #3   Title  Pt will demo IND with HEP, fitness exercises with weights/ resistance bands to minimize injuries,  and work modifications with sitting, turning at her work station , and lifting her suitcase with proper mechanics     Time  6    Period  Weeks    Status  Partially Met      PT LONG TERM GOAL #4   Title  Pt will decrease her PGQ score 23% to <18% in order to have less pain with sit to stand, lifting     Time  6    Period  Weeks    Status  On-going      PT LONG TERM GOAL #5   Title  Pt will demo proper pelvic floor lengthening and contraction Grade 4/5 for 5 reps, 5 sec in order to increase pelvic girdle stability to progress towards horseback riding     Time  8    Period  Weeks    Status  New    Target Date  03/07/18      Additional Long Term Goals   Additional Long Term Goals  Yes      PT LONG TERM GOAL #6   Title   PSFS score to decrease from 0/10 for  horseback riding,  yoga, running 0/10    Time  12    Status  On-going    Target Date  04/04/18            Plan - 01/23/18 0911    Clinical Impression Statement  Pt is progressing well with increased R hip strength but has weakness on  L.  Advanced to CKC exercises for improving lower kinetic chain ( ankle instability) and stair navigation. Pt tolerated new HEP.  Pt continues to benefit from skilled PT.     Rehab Potential  Good    PT Frequency  2x / week    PT Duration  6 weeks    PT Treatment/Interventions  Therapeutic exercise;Therapeutic activities;Patient/family education;Balance training;Gait training;Moist Heat;Traction;Stair training;Neuromuscular re-education;Manual techniques;Scar mobilization    Consulted and Agree with Plan of Care  Patient       Patient will benefit from skilled therapeutic intervention in order to improve the following deficits and impairments:  Improper body mechanics, Pain, Decreased mobility, Decreased coordination, Decreased scar mobility, Increased muscle spasms, Postural dysfunction, Decreased endurance, Decreased activity tolerance, Decreased balance, Decreased strength, Decreased range of motion, Hypomobility  Visit Diagnosis: Sacrococcygeal disorders, not elsewhere classified  Myalgia  Muscle weakness (generalized)     Problem List There are no active problems to display for this patient.   [name] ,PT, DPT, E-RYT  01/23/2018, 10:20 AM  [place] MAIN [facility] SERVICES [address] [place], [place], [address] Phone: [phone]   Fax:  [phone]  Name: Sonya Hart MRN: [id number] Date of Birth: 10/25/1970

## 2018-01-23 NOTE — Patient Instructions (Signed)
Strengthening on L side with previous exercises:   ______  At work: 2 flights of stairs, pushing off ballmounds, slow lowering and controlled on descent   Stretches: Warrior, reverse warrior  (Stretching calves) Seated hamstrings, toes up   ________    Red band ( stirrup) push down  Elbow by side,  10 x 2  Both sides   Warrior III with red band  Red band under ballmound and transverse arch of R foot, pull band around pinky toes side, hold in L hand, Spread toes, ballmounds firm  10 x 3     Blue band  At doorknob, On trampoline   Facing door, holding band like reins  Minor ribcage turns, elbows back  30 sec x 3-5 reps  ( build up to 1-2 min)    Facing away from door  band at thighs   resisted hip flexion strengthening  30 sec x 3-5 reps  ( build up to 1-2 min)

## 2018-01-24 DIAGNOSIS — S329XXD Fracture of unspecified parts of lumbosacral spine and pelvis, subsequent encounter for fracture with routine healing: Secondary | ICD-10-CM | POA: Diagnosis not present

## 2018-01-31 ENCOUNTER — Ambulatory Visit: Payer: 59 | Admitting: Physical Therapy

## 2018-01-31 DIAGNOSIS — M6281 Muscle weakness (generalized): Secondary | ICD-10-CM | POA: Diagnosis not present

## 2018-01-31 DIAGNOSIS — M4126 Other idiopathic scoliosis, lumbar region: Secondary | ICD-10-CM | POA: Diagnosis not present

## 2018-01-31 DIAGNOSIS — M791 Myalgia, unspecified site: Secondary | ICD-10-CM | POA: Diagnosis not present

## 2018-01-31 DIAGNOSIS — M533 Sacrococcygeal disorders, not elsewhere classified: Secondary | ICD-10-CM | POA: Diagnosis not present

## 2018-01-31 NOTE — Therapy (Signed)
[place] MAIN [facility] SERVICES [address] [place], [place], [address] Phone: [phone]   Fax:  [phone]  Physical Therapy Treatment / Progress Note   Patient Details  Name: Sonya Hart MRN: [id number] Date of Birth: 10-28-1970 Referring Provider: Harlow Mares   Encounter Date: 01/31/2018  PT End of Session - 01/31/18 0907    Visit Number  10    Number of Visits  12    Date for PT Re-Evaluation  03/07/18    PT Start Time  0803    PT Stop Time  0907    PT Time Calculation (min)  64 min    Activity Tolerance  Patient tolerated treatment well;No increased pain    Behavior During Therapy  WFL for tasks assessed/performed       Past Medical History:  Diagnosis Date  . Graves disease    radiation to thyroid 1992    No past surgical history on file.  There were no vitals filed for this visit.  Subjective Assessment - 01/31/18 0810    Subjective  Pt reports she has not been able to get a trampoline. Work has taking up her evening time but she is able to do her floor exercises. She is walking more at the speed she was prior to injury. Pt returned to horseback riding but only cantered and trotted. Pt noticed sensitivity in the R sitting bone but experienced no pain.            [facility] PT Assessment - 01/31/18 0814      Strength   Overall Strength Comments  hip abd L 5/5, hip ext R 3+/5, L 5/5, hip flexion 5/5B                    [facility] Adult PT Treatment/Exercise - 01/31/18 1425      Neuro Re-ed    Neuro Re-ed Details   see pt instructions                  PT Long Term Goals - 01/31/18 0908      PT LONG TERM GOAL #1   Title  Pt will demo increased hip strength flexion, abduction, knee extension B 4/5 in order to progress to loaded, upright  exercises to return to fitness routines    Baseline 01/10/18:  hip abd R 4+/5, L 4-/5   , hamstring 3+/5 R, 4-/5 L ,  hip ext L 4-/5, 4/5,  hip flexion L 4/5, R  4+/5   7/23: hip abd 5/5   , hamstring 3+/5 R, 5/5L ,  hip ext R 4-/5, 5/5,  hip flexion B 5/5     Time  4    Period  Weeks    Status  Partially Met      PT LONG TERM GOAL #2   Title  Pt will demo decreased R paraspinal/ QL/ pelvic floor tensions and tenderness with less R deviation of lumbar segments at L1-2 across 2 visits in order to restore proper sitting posture and to return to horseback riding     Time  3    Period  Weeks    Status  Achieved      PT LONG TERM GOAL #3   Title  Pt will demo IND with HEP, fitness exercises with weights/ resistance bands to minimize injuries,  and work modifications with sitting, turning at her work station , and lifting her suitcase with proper mechanics     Time  6    Period  Weeks    Status  Partially Met      PT LONG TERM GOAL #4   Title  Pt will decrease her PGQ score 23% to <18% in order to have less pain with sit to stand, lifting  (7/23: 5%)     Time  6    Period  Weeks    Status  Achieved      PT LONG TERM GOAL #5   Title  Pt will demo proper pelvic floor lengthening and contraction Grade 4/5 for 5 reps, 5 sec in order to increase pelvic girdle stability to progress towards horseback riding     Time  8    Period  Weeks    Status  On-going      PT LONG TERM GOAL #6   Title   PSFS score to decrease from 0/10 for  horseback riding,  yoga, running 0/10   (7/23: horseback and yoga 5/10, running 0/10)     Time  12    Status  Partially Met            Plan - 01/31/18 0908    Clinical Impression Statement Pt has achieved 2/6 goals with decreased score on Pelvic Girdle Questionnaire from 23% to 5% which indicates improved function. Pt has partially met the goal of returning to yoga but still needs more skilled PT to return to horseback riding and running routine.  Pt no longer shows deviations to her spine nor pelvic girdle alignment with positive response to Tx. Pt's R hip abduction has increased in strength but lacks knee flexion and  hip extension strength. Pt has progressed to closed kinetic chain strengthening exercises with resistance band and demonstrated less lumbopelvic pertubation compared to prior visits. However, pt showed internal rotation of knees with genu valgus and required excessive cues for proper lower kinetic chain alignment and activation.  Pt continues to require skilled PT to advance her exericses in order to gain strength , and stability to achieve remaining goals.  Decreasing frequency to 1 x week from 2 x week because pt has made progress.    Rehab Potential  Good    PT Frequency  1x / week    PT Duration  12 weeks    PT Treatment/Interventions  Therapeutic exercise;Therapeutic activities;Patient/family education;Balance training;Gait training;Moist Heat;Traction;Stair training;Neuromuscular re-education;Manual techniques;Scar mobilization    Consulted and Agree with Plan of Care  Patient       Patient will benefit from skilled therapeutic intervention in order to improve the following deficits and impairments:  Improper body mechanics, Pain, Decreased mobility, Decreased coordination, Decreased scar mobility, Increased muscle spasms, Postural dysfunction, Decreased endurance, Decreased activity tolerance, Decreased balance, Decreased strength, Decreased range of motion, Hypomobility  Visit Diagnosis: Sacrococcygeal disorders, not elsewhere classified  Myalgia  Muscle weakness (generalized)     Problem List There are no active problems to display for this patient.   Sonya Hart ,PT, DPT, E-RYT   01/31/2018, 2:25 PM  [place] MAIN [facility] SERVICES [address] [place], [place], [address] Phone: [phone]   Fax:  [phone]  Name: Sonya Hart MRN: [id number] Date of Birth: 18-Dec-1970

## 2018-01-31 NOTE — Patient Instructions (Addendum)
Yoga strap on thigh closest to the door, loop on opp foot. Warrior III  --> half moon pose with hand light on block  Make sure you keep your knee along 2-3rd toe line  Get out of pose :  Warrior III and then stand 3 reps   _______  Progress bird dog with blue band under [place] of leg going up R, R hand with band on floor  Do not lift off floor if pelvis is wobbly 10 x 2  ________ Side lying ( see video)   1) minor tricep curls while knee bends and straightens against resistance 10x 2  2) stabilizing point: elbow straight, leg circles from middle going out and back    Complimentary stretches: Figure 4 Cross thigh over  Reclined side twist with R ankle over block (length side up)  Practice mini squat with simulated jumps, activate feet more 10 x   ________  We will do fire hydrant exercise next time

## 2018-02-06 ENCOUNTER — Ambulatory Visit: Payer: 59 | Admitting: Physical Therapy

## 2018-02-06 DIAGNOSIS — M6281 Muscle weakness (generalized): Secondary | ICD-10-CM

## 2018-02-06 DIAGNOSIS — M791 Myalgia, unspecified site: Secondary | ICD-10-CM

## 2018-02-06 DIAGNOSIS — M533 Sacrococcygeal disorders, not elsewhere classified: Secondary | ICD-10-CM

## 2018-02-06 DIAGNOSIS — M4126 Other idiopathic scoliosis, lumbar region: Secondary | ICD-10-CM | POA: Diagnosis not present

## 2018-02-06 NOTE — Patient Instructions (Addendum)
Downgrade last session's exercises to green band   Energy quick picker uppers:  Drop arms and swing up, trunk parallel , knees bents - exhale down 3-5 x   ( [place] woman)   Lymph juggle, pump heels, shakes loose    Legs up the wall restorative ( stretches hamstring as well)

## 2018-02-06 NOTE — Therapy (Signed)
[place] MAIN [facility] SERVICES [address] [place], [place], [address] Phone: [phone]   Fax:  [phone]  Physical Therapy Treatment  Patient Details  Name: Sonya Hart MRN: [id number] Date of Birth: 1970/09/18 Referring Provider: Harlow Mares   Encounter Date: 02/06/2018  PT End of Session - 02/06/18 0924    Visit Number  11    Number of Visits  12    Date for PT Re-Evaluation  03/07/18    PT Start Time  0811    PT Stop Time  0904    PT Time Calculation (min)  53 min    Activity Tolerance  Patient tolerated treatment well;No increased pain    Behavior During Therapy  WFL for tasks assessed/performed       Past Medical History:  Diagnosis Date  . Graves disease    radiation to thyroid 1992    No past surgical history on file.  There were no vitals filed for this visit.  Subjective Assessment - 02/06/18 0847    Subjective  pt reports she feels too tired from work and has difficulty performing her new HEP. She does not have pain with the HEP. Pt wishes she can get more sleep and feel less exhausted from work         [facility] PT Assessment - 02/06/18 0849      Observation/Other Assessments   Observations  pt performs with less difficulty with green resistance band with last HEP                    [facility] Adult PT Treatment/Exercise - 02/06/18 0850      Therapeutic Activites    Therapeutic Activities  --  down graded band resistance from blue to green band             PT Education - 02/06/18 0924    Education Details  HEP    Person(s) Educated  Patient    Methods  Explanation;Tactile cues;Verbal cues;Handout;Demonstration    Comprehension  Verbalized understanding;Returned demonstration;Verbal cues required;Tactile cues required          PT Long Term Goals - 01/31/18 0908      PT LONG TERM GOAL #1   Title  Pt will demo increased hip strength flexion, abduction, knee extension B 4/5 in order to  progress to loaded, upright  exercises to return to fitness routines    Baseline  01/10/18:  hip abd R 4+/5, L 4-/5   , hamstring 3+/5 R, 4-/5 L ,  hip ext L 4-/5, 4/5,  hip flexion L 4/5, R 4+/5._____7/23: hip abd 5/5   , hamstring 3+/5 R, 5/5L ,  hip ext R 4-/5, 5/5,  hip flexion B 5/5     Time  4    Period  Weeks    Status  Partially Met      PT LONG TERM GOAL #2   Title  Pt will demo decreased R paraspinal/ QL/ pelvic floor tensions and tenderness with less R deviation of lumbar segments at L1-2 across 2 visits in order to restore proper sitting posture and to return to horseback riding     Time  3    Period  Weeks    Status  Achieved      PT LONG TERM GOAL #3   Title  Pt will demo IND with HEP, fitness exercises with weights/ resistance bands to minimize injuries,  and work modifications with sitting, turning at her work station ,  and lifting her suitcase with proper mechanics     Time  6    Period  Weeks    Status  Partially Met      PT LONG TERM GOAL #4   Title  Pt will decrease her PGQ score 23% to <18% in order to have less pain with sit to stand, lifting  (7/23: 5%)     Time  6    Period  Weeks    Status  Achieved      PT LONG TERM GOAL #5   Title  Pt will demo proper pelvic floor lengthening and contraction Grade 4/5 for 5 reps, 5 sec in order to increase pelvic girdle stability to progress towards horseback riding     Time  8    Period  Weeks    Status  On-going      PT LONG TERM GOAL #6   Title   PSFS score to decrease from 0/10 for  horseback riding,  yoga, running 0/10   (7/23: horseback and yoga 5/10, running 0/10)     Time  12    Status  Partially Met            Plan - 02/06/18 0924    Clinical Impression Statement  Downgraded resistance band from blue to green with previous HEP. Pt demo'd less difficulty and better form. Anticipate this resistance level will continue to help pt progress with her strengthening. Relaxation practices and resources were provided  as pt reported she continues to feel fatigued from work load. Provided active listening and motivation interviewing on stress-manage strategies. Added movement practices for increasing energy. Pt demo'd correctly and had no complaints with these ex.   Pt continues to benefit from skilled PT     Rehab Potential  Good    PT Frequency  1x / week    PT Duration  12 weeks    PT Treatment/Interventions  Therapeutic exercise;Therapeutic activities;Patient/family education;Balance training;Gait training;Moist Heat;Traction;Stair training;Neuromuscular re-education;Manual techniques;Scar mobilization    Consulted and Agree with Plan of Care  Patient       Patient will benefit from skilled therapeutic intervention in order to improve the following deficits and impairments:  Improper body mechanics, Pain, Decreased mobility, Decreased coordination, Decreased scar mobility, Increased muscle spasms, Postural dysfunction, Decreased endurance, Decreased activity tolerance, Decreased balance, Decreased strength, Decreased range of motion, Hypomobility  Visit Diagnosis: Sacrococcygeal disorders, not elsewhere classified  Myalgia  Muscle weakness (generalized)     Problem List There are no active problems to display for this patient.   [name] ,PT, DPT, E-RYT  02/06/2018, 9:27 AM  [place] MAIN [facility] SERVICES [address] [place], [place], [address] Phone: [phone]   Fax:  [phone]  Name: Sonya Hart MRN: [id number] Date of Birth: [birth year]

## 2018-02-13 ENCOUNTER — Ambulatory Visit: Payer: 59 | Attending: Orthopedic Surgery | Admitting: Physical Therapy

## 2018-02-13 DIAGNOSIS — M791 Myalgia, unspecified site: Secondary | ICD-10-CM | POA: Diagnosis not present

## 2018-02-13 DIAGNOSIS — M6281 Muscle weakness (generalized): Secondary | ICD-10-CM

## 2018-02-13 DIAGNOSIS — M533 Sacrococcygeal disorders, not elsewhere classified: Secondary | ICD-10-CM | POA: Diagnosis not present

## 2018-02-13 NOTE — Therapy (Signed)
[place] MAIN [facility] SERVICES [address] [place], [place], [address] Phone: [phone]   Fax:  [phone]  Physical Therapy Treatment  Patient Details  Name: Sonya Hart MRN: [id number] Date of Birth: 1971-07-07 Referring Provider: Harlow Mares   Encounter Date: 02/13/2018  PT End of Session - 02/13/18 1354    Visit Number  12    Date for PT Re-Evaluation  03/07/18    PT Start Time  0810    PT Stop Time  0912    PT Time Calculation (min)  62 min    Activity Tolerance  Patient tolerated treatment well;No increased pain    Behavior During Therapy  WFL for tasks assessed/performed       Past Medical History:  Diagnosis Date  . Graves disease    radiation to thyroid 1992    No past surgical history on file.  There were no vitals filed for this visit.  Subjective Assessment - 02/13/18 0818    Subjective  Pt has gone horseback riding ( just walking at slow pace) twice this past week. Pt continues to feel overwhelmed and exhausted from workload. Pt feels she has not had her outlet for stress relief which is through horseback riding. Pt feels she is still weak when horseback riding.          [facility] PT Assessment - 02/13/18 1353      Other:   Other/Comments  simulated working work trot on horse which involved mini squat with posterior tilt of pelvis. Pt demo'd toe gripping and minimal co-activation of transverse arches        Hip strength: hip ext and abd 5/5 B            [facility] Adult PT Treatment/Exercise - 02/13/18 1341      Therapeutic Activites    Therapeutic Activities  -- stress management strategies and applying stretches w/ horse      Neuro Re-ed    Neuro Re-ed Details   see pt instructions                  PT Long Term Goals - 01/31/18 0908      PT LONG TERM GOAL #1   Title  Pt will demo increased hip strength flexion, abduction, knee extension B 4/5 in order to progress to loaded, upright   exercises to return to fitness routines    Baseline  01/10/18:  hip abd R 4+/5, L 4-/5   , hamstring 3+/5 R, 4-/5 L ,  hip ext L 4-/5, 4/5,  hip flexion L 4/5, R 4+/5._____7/23: hip abd 5/5   , hamstring 3+/5 R, 5/5L ,  hip ext R 4-/5, 5/5,  hip flexion B 5/5     Time  4    Period  Weeks    Status  Partially Met      PT LONG TERM GOAL #2   Title  Pt will demo decreased R paraspinal/ QL/ pelvic floor tensions and tenderness with less R deviation of lumbar segments at L1-2 across 2 visits in order to restore proper sitting posture and to return to horseback riding     Time  3    Period  Weeks    Status  Achieved      PT LONG TERM GOAL #3   Title  Pt will demo IND with HEP, fitness exercises with weights/ resistance bands to minimize injuries,  and work modifications with sitting, turning at her work station , and  lifting her suitcase with proper mechanics     Time  6    Period  Weeks    Status  Partially Met      PT LONG TERM GOAL #4   Title  Pt will decrease her PGQ score 23% to <18% in order to have less pain with sit to stand, lifting  (7/23: 5%)     Time  6    Period  Weeks    Status  Achieved      PT LONG TERM GOAL #5   Title  Pt will demo proper pelvic floor lengthening and contraction Grade 4/5 for 5 reps, 5 sec in order to increase pelvic girdle stability to progress towards horseback riding     Time  8    Period  Weeks    Status  On-going      PT LONG TERM GOAL #6   Title   PSFS score to decrease from 0/10 for  horseback riding,  yoga, running 0/10   (7/23: horseback and yoga 5/10, running 0/10)     Time  12    Status  Partially Met            Plan - 02/13/18 1355    Clinical Impression Statement Pt continues to show increased overall hip strength.  Added upright strengthening exercise that simulates a trotting motion with horseback riding. Pt demo'd correctly with resistance band but required more cues for more transverse arch co-activation. Pt has had no pain with  horseback riding ( slow pace walking) which she did twice last week. Pt expressed her need for horseback riding to release stress. Pt has been feeling overwhelmed and exhausted at work. Pt was provided motivational interviewing to help pt find strategies to minimize stress with explanation of the role that stress plays on inflammation and healing process. Pt voiced understanding and willingness to try different stress reduction strategies. Added stretches for hamstring/ calfs in yoga positions that can be applied when pt is grooming horse. Pt continues to benefit from skilled PT.    Rehab Potential  Good    PT Frequency  1x / week    PT Duration  12 weeks    PT Treatment/Interventions  Therapeutic exercise;Therapeutic activities;Patient/family education;Balance training;Gait training;Moist Heat;Traction;Stair training;Neuromuscular re-education;Manual techniques;Scar mobilization    Consulted and Agree with Plan of Care  Patient       Patient will benefit from skilled therapeutic intervention in order to improve the following deficits and impairments:  Improper body mechanics, Pain, Decreased mobility, Decreased coordination, Decreased scar mobility, Increased muscle spasms, Postural dysfunction, Decreased endurance, Decreased activity tolerance, Decreased balance, Decreased strength, Decreased range of motion, Hypomobility  Visit Diagnosis: Myalgia  Sacrococcygeal disorders, not elsewhere classified  Muscle weakness (generalized)     Problem List There are no active problems to display for this patient.   [name] ,PT, DPT, E-RYT  02/13/2018, 3:58 PM  [place] MAIN [facility] SERVICES [address] [place], [place], [address] Phone: [phone]   Fax:  [phone]  Name: GIANNA CALEF MRN: [id number] Date of Birth: 04-15-1971

## 2018-02-13 NOTE — Patient Instructions (Addendum)
Grooming horse :  extended side angle   body turned 45 deg to horse   wide stance: goddess pose, knees track with toes in "V" , weight shift to one side 70% on one leg   facing horse   hamstring stretch one knee bent: other knee extended,  butt moves back in the direction of the bent knee   Work or home strengthening to prepare for posting/ working trot   Mini squats with bicep curls with blue band 10 x  With transverse arch activated,    Stretches: Wall hand glide with warrior I position, slide down arm , back knee bends in,  Slide arm up, extend knee for calf stretch  Toes and heels straight      hamstring stretch one knee bent: other knee extended,  butt moves back in the direction of the bent knee

## 2018-02-20 ENCOUNTER — Ambulatory Visit: Payer: 59 | Admitting: Physical Therapy

## 2018-02-20 DIAGNOSIS — M791 Myalgia, unspecified site: Secondary | ICD-10-CM

## 2018-02-20 DIAGNOSIS — M533 Sacrococcygeal disorders, not elsewhere classified: Secondary | ICD-10-CM | POA: Diagnosis not present

## 2018-02-20 DIAGNOSIS — M6281 Muscle weakness (generalized): Secondary | ICD-10-CM

## 2018-02-20 NOTE — Patient Instructions (Addendum)
Discussed the requirements for progressing to running:   maintain stretches  add single leg heel raises  10 reps x 3 x day    Walk on trails and inclines   Jog at running track ( soft ground) up to 1 mile (4 laps) Notice the stride, trunk rotation, elbows by side    _______ Another adductor stretch for yoga:  gate pose ( insert after double kneeling)   Gate pose:  Start in half kneeling Hips are squared to the front, knees hip width apart, back toes tucked under Sidebend like a wall is behind you x 2 Sidebend like a wall is behind and then five high the sky (trunk rotation) x 2

## 2018-02-20 NOTE — Therapy (Signed)
[place] MAIN [facility] SERVICES [address] [place], [place], [address] Phone: [phone]   Fax:  [phone]  Physical Therapy Treatment  Patient Details  Name: Sonya Hart MRN: [id number] Date of Birth: 1971/04/03 Referring Provider: Harlow Mares   Encounter Date: 02/20/2018  PT End of Session - 02/20/18 0856    Visit Number  13    Date for PT Re-Evaluation  03/07/18    PT Start Time  0807    PT Stop Time  0855    PT Time Calculation (min)  48 min    Activity Tolerance  Patient tolerated treatment well;No increased pain    Behavior During Therapy  WFL for tasks assessed/performed       Past Medical History:  Diagnosis Date  . Graves disease    radiation to thyroid 1992    No past surgical history on file.  There were no vitals filed for this visit.  Subjective Assessment - 02/20/18 0810    Subjective  Pt report she tried the working trot on her horse and she had no issues. She felt stronger. Pt also helped with a ramp building activity with her church and felt tired.          [facility] PT Assessment - 02/20/18 0828      Strength   Overall Strength Comments  hamstring 4+/5, posteriro sling/ glut 5/5 wihthout perturbations, plantarflexion 4/5 for 12 reps without single UE on wall        Ambulation/Gait   Gait Comments  jogging on trampoline: limited trunk rotation                    [facility] Adult PT Treatment/Exercise - 02/20/18 0828      Therapeutic Activites    Therapeutic Activities  --   stress management strategies and applying stretches w/ horse     Neuromuscular re-edu : see pt isntructions        PT Education - 02/20/18 0856    Education Details  HEP    Person(s) Educated  Patient    Methods  Explanation;Demonstration;Tactile cues;Verbal cues;Handout    Comprehension  Returned demonstration;Verbalized understanding;Verbal cues required          PT Long Term Goals - 02/20/18 0817      PT  LONG TERM GOAL #1   Title  Pt will demo increased hip strength flexion, abduction, knee extension B 4/5 in order to progress to loaded, upright  exercises to return to fitness routines    Baseline  01/10/18:  hip abd R 4+/5, L 4-/5   , hamstring 3+/5 R, 4-/5 L ,  hip ext L 4-/5, 4/5,  hip flexion L 4/5, R 4+/5._____7/23: hip abd 5/5   , hamstring 3+/5 R, 5/5L ,  hip ext R 4-/5, 5/5,  hip flexion B 5/5     Time  4    Period  Weeks    Status  Partially Met      PT LONG TERM GOAL #2   Title  Pt will demo decreased R paraspinal/ QL/ pelvic floor tensions and tenderness with less R deviation of lumbar segments at L1-2 across 2 visits in order to restore proper sitting posture and to return to horseback riding     Time  3    Period  Weeks    Status  Achieved      PT LONG TERM GOAL #3   Title  Pt will demo IND with HEP, fitness exercises  with weights/ resistance bands to minimize injuries,  and work modifications with sitting, turning at her work station , and lifting her suitcase with proper mechanics     Time  6    Period  Weeks    Status  Partially Met      PT LONG TERM GOAL #4   Title  Pt will decrease her PGQ score 23% to <18% in order to have less pain with sit to stand, lifting  (7/23: 5%)     Time  6    Period  Weeks    Status  Achieved      PT LONG TERM GOAL #5   Title  Pt will demo proper pelvic floor lengthening and contraction Grade 4/5 for 5 reps, 5 sec in order to increase pelvic girdle stability to progress towards horseback riding     Time  8    Period  Weeks    Status  On-going      PT LONG TERM GOAL #6   Title   PSFS score to decrease from 0/10 for  horseback riding,  yoga, running 0/10   (7/23: horseback and yoga 5/10, running 0/10)     Time  12    Status  Partially Met            Plan - 02/20/18 0857    Clinical Impression Statement  Pt progressed to single leg heel raises with deviations, indicating single leg stronger lower kinetic chain strength with pelvic  stability . Pt demo'd improved trunk rotation while jogging on trampoline. Pt is ready for levelled ground jogging on track ( not pavement). Pt is also ready more more walking on inclines along uneven ground on trails.  Pt's hamstring strength has improved.  Withholding running on pavement. Provided more adductor stretches and educated the importance of maintaining abductor/ posterior lower chaing strengthening and adductor stretching as pt gradually retruns to running.  Pt continues to benefit from skilled PT.      Rehab Potential  Good    PT Frequency  1x / week    PT Duration  12 weeks    PT Treatment/Interventions  Therapeutic exercise;Therapeutic activities;Patient/family education;Balance training;Gait training;Moist Heat;Traction;Stair training;Neuromuscular re-education;Manual techniques;Scar mobilization    Consulted and Agree with Plan of Care  Patient       Patient will benefit from skilled therapeutic intervention in order to improve the following deficits and impairments:  Improper body mechanics, Pain, Decreased mobility, Decreased coordination, Decreased scar mobility, Increased muscle spasms, Postural dysfunction, Decreased endurance, Decreased activity tolerance, Decreased balance, Decreased strength, Decreased range of motion, Hypomobility  Visit Diagnosis: Myalgia  Sacrococcygeal disorders, not elsewhere classified  Muscle weakness (generalized)     Problem List There are no active problems to display for this patient.   [name] ,PT, DPT, E-RYT  02/20/2018, 9:08 AM  [place] MAIN [facility] SERVICES [address] [place], [place], [address] Phone: [phone]   Fax:  [phone]  Name: Sonya Hart MRN: [id number] Date of Birth: Nov 09, 1970

## 2018-02-27 ENCOUNTER — Ambulatory Visit: Payer: 59 | Admitting: Physical Therapy

## 2018-02-27 DIAGNOSIS — M533 Sacrococcygeal disorders, not elsewhere classified: Secondary | ICD-10-CM | POA: Diagnosis not present

## 2018-02-27 DIAGNOSIS — M791 Myalgia, unspecified site: Secondary | ICD-10-CM | POA: Diagnosis not present

## 2018-02-27 DIAGNOSIS — M6281 Muscle weakness (generalized): Secondary | ICD-10-CM

## 2018-02-27 NOTE — Therapy (Addendum)
[place] MAIN [facility] SERVICES [address] [place], [place], [address] Phone: [phone]   Fax:  [phone]  Physical Therapy Treatment  Patient Details  Name: Sonya Hart MRN: [id number] Date of Birth: 07-12-1971 Referring Provider: Harlow Mares   Encounter Date: 02/27/2018  PT End of Session - 02/27/18 0920    Visit Number  14    Date for PT Re-Evaluation  03/07/18    PT Start Time  0807    PT Stop Time  0904    PT Time Calculation (min)  57 min    Activity Tolerance  Patient tolerated treatment well;No increased pain    Behavior During Therapy  WFL for tasks assessed/performed       Past Medical History:  Diagnosis Date  . Graves disease    radiation to thyroid 1992    No past surgical history on file.  There were no vitals filed for this visit.  Subjective Assessment - 02/27/18 0810    Subjective  Pt report she did not get to get a small trampoline this week nor try jogging because she was busy doing charts and stayed busy with friends and family. Pt went outside and took a break to play with her dog  after charing 5 charts . Pt realizes she needs to be outside for stress-relief.  Pt is ready to get back to horseback riding 2-3 x week but her schedule with work limits that. Pt was able to do more horseriding techniques without pain but she notices her endurance is not like used to be.           [facility] PT Assessment - 02/27/18 0917      Jumping   Comments  genu valgus. cued for hip ER/ knee/ feet alignment  , proper eccentric control       Running   Comments  running on treadmill: poor eccentric control, short strides at 3.6 mph ( preferred speed). improved mechanics at 3.1 mph with training.       Strength   Overall Strength Comments  hip ext/ abd posterior chain thoracolumbar  5/5 B                    [facility] Adult PT Treatment/Exercise - 02/27/18 0263      Ambulation/Gait   Gait Comments  running on  treadmill: poor eccentric control, short strides at 3.6 mph ( preferred speed). improved mechanics at 3.1 mph with training.  Pt achieved 15 min with 10 min at 3.1 mph no incline.      Therapeutic Activites    Therapeutic Activities  --   stress management strategies and applying stretches w/ horse     Neuro Re-ed    Neuro Re-ed Details   see pt instructions, pylometric/ jogging mechanics              PT Education - 02/27/18 0917    Education Details  HEP    Person(s) Educated  Patient    Methods  Explanation;Demonstration;Verbal cues;Handout;Tactile cues    Comprehension  Returned demonstration;Verbalized understanding          PT Long Term Goals - 02/20/18 0817      PT LONG TERM GOAL #1   Title  Pt will demo increased hip strength flexion, abduction, knee extension B 4/5 in order to progress to loaded, upright  exercises to return to fitness routines    Baseline  01/10/18:  hip abd R 4+/5, L 4-/5   ,  hamstring 3+/5 R, 4-/5 L ,  hip ext L 4-/5, 4/5,  hip flexion L 4/5, R 4+/5._____7/23: hip abd 5/5   , hamstring 3+/5 R, 5/5L ,  hip ext R 4-/5, 5/5,  hip flexion B 5/5     Time  4    Period  Weeks    Status  Partially Met      PT LONG TERM GOAL #2   Title  Pt will demo decreased R paraspinal/ QL/ pelvic floor tensions and tenderness with less R deviation of lumbar segments at L1-2 across 2 visits in order to restore proper sitting posture and to return to horseback riding     Time  3    Period  Weeks    Status  Achieved      PT LONG TERM GOAL #3   Title  Pt will demo IND with HEP, fitness exercises with weights/ resistance bands to minimize injuries,  and work modifications with sitting, turning at her work station , and lifting her suitcase with proper mechanics     Time  6    Period  Weeks    Status  Partially Met      PT LONG TERM GOAL #4   Title  Pt will decrease her PGQ score 23% to <18% in order to have less pain with sit to stand, lifting  (7/23: 5%)     Time  6     Period  Weeks    Status  Achieved      PT LONG TERM GOAL #5   Title  Pt will demo proper pelvic floor lengthening and contraction Grade 4/5 for 5 reps, 5 sec in order to increase pelvic girdle stability to progress towards horseback riding     Time  8    Period  Weeks    Status  On-going      PT LONG TERM GOAL #6   Title   PSFS score to decrease from 0/10 for  horseback riding,  yoga, running 0/10   (7/23: horseback and yoga 5/10, running 0/10)     Time  12    Status  Partially Met            Plan - 02/27/18 0923    Clinical Impression Statement  Pt showed sufficent strength at hips, hamstrings, gluts, and thoracolumbar mm and is ready for jogging. Running mechanics assessment showed short stride, heel striking, and decreased hip flexion. Following training, pt demo;d proper technique at slower speed. Pt was educated on jogging under 1 mile, on levelled ground 3x week this week with stretches after running. Pt also progressed to pylometrics today but required propioception training to minimize genu valgus. Plan to reassess goals next session.  Pt has been able to return to hors back riding 1 x week but requires more endurance to achieve 2-3 x week. Pt required biopsychoscoial approaches and motivation interviewing to find strategies for work-life balance and stress relief. Pt will be on vacation next week. Pt was recommended to create a journal to write down memories and self-reflections. Provided pt resources for acupuncture and massage.  Pt continues to benefit from skilled PT.      Rehab Potential  Good    PT Frequency  1x / week    PT Duration  12 weeks    PT Treatment/Interventions  Therapeutic exercise;Therapeutic activities;Patient/family education;Balance training;Gait training;Moist Heat;Traction;Stair training;Neuromuscular re-education;Manual techniques;Scar mobilization    Consulted and Agree with Plan of Care  Patient  Patient will benefit from skilled therapeutic  intervention in order to improve the following deficits and impairments:  Improper body mechanics, Pain, Decreased mobility, Decreased coordination, Decreased scar mobility, Increased muscle spasms, Postural dysfunction, Decreased endurance, Decreased activity tolerance, Decreased balance, Decreased strength, Decreased range of motion, Hypomobility  Visit Diagnosis: Myalgia  Sacrococcygeal disorders, not elsewhere classified  Muscle weakness (generalized)     Problem List There are no active problems to display for this patient.   [name] ,PT, DPT, E-RYT  02/27/2018, 9:32 AM  [place] MAIN [facility] SERVICES [address] [place], [place], [address] Phone: [phone]   Fax:  [phone]  Name: Sonya Hart MRN: [id number] Date of Birth: 1971/05/27

## 2018-02-27 NOTE — Patient Instructions (Addendum)
High intensity workouts/ pylometrics   -step up and down L leading up, both feet  30 reps ( then switch)  Lower nice and slow ont eh step down backwards    Jumping jacks with toes and knees pointed out and feet together in ballerina to minimize knee bowing in 5 reps as  work breaks    Progress heel raises w/ ballerina stance 10 reps    Calf stretches  ( ski track stance, longers length, heel down, bent knee back and fort ( back leg)   _____ Building endurance Running on treadmill or levelled grass    0-5:00  warm up 2.3-2.6 mph high thigs, soft landing, longer stride  5:01-15:00 3.1 mph  15:01-20:00 cool down 2.6-2.0 mph  3 x week, with post stretches   ________ Relaxation:  Journal on your trip , write down memories with family and self-reflections   [email or web address]  [email or web address]  ( acupuncture)

## 2018-03-06 ENCOUNTER — Ambulatory Visit: Payer: 59 | Admitting: Physical Therapy

## 2018-03-28 ENCOUNTER — Ambulatory Visit: Payer: 59 | Attending: Orthopedic Surgery | Admitting: Physical Therapy

## 2018-03-28 DIAGNOSIS — M791 Myalgia, unspecified site: Secondary | ICD-10-CM

## 2018-03-28 DIAGNOSIS — M4126 Other idiopathic scoliosis, lumbar region: Secondary | ICD-10-CM

## 2018-03-28 DIAGNOSIS — M533 Sacrococcygeal disorders, not elsewhere classified: Secondary | ICD-10-CM | POA: Diagnosis not present

## 2018-03-28 DIAGNOSIS — M6281 Muscle weakness (generalized): Secondary | ICD-10-CM | POA: Diagnosis not present

## 2018-03-28 NOTE — Patient Instructions (Addendum)
Do not walk backwards uphill nor on a treadmill for safety  There are other ways to train hamstrings   Sideways walking on the bridges of the trail is ok   ______ This week     Mon/ Wed- riding with yoga/ stretches   Tues/ Thurs- Stretches/ yoga  prior and after   Run on the tread mill  10 min, warm up, slow speed,   1 miles flat incline ( mod speed to maintain proper form - landing soft  3.1 mph work up to 3.6 mph not 3.6 mph which your previous speed) , 10 min cool down slow speed.   ( mark the # of miles total)    Friday- no physical activity:  Restorative yoga of 2-3 poses ( 45 min - 1 hour)   Weekend- 5 miles -one  hike   Maintain 30 min of resistance bands  Daily    _______  Jumping sideways onto Bosu ball with a kick out like dog peeing at fire hydrant  10 x 2 reps each   Hopping forward onto center of Bosu ball with heel raise, ankle stable , lowering slow on opp leg to floor as you step off  10 x 2 reps     Put band over head to lower to waist, Getting out of it by pulling over head again  Trampoline jogging , facing door with blue band, pulling to side pockets for latissimiss 3 min Trampoline jogging , turned away from door, elbow bent, squeezing shoulder blades 3 min

## 2018-03-30 NOTE — Therapy (Signed)
[place] MAIN [facility] SERVICES [address] [place], [place], [address] Phone: [phone]   Fax:  [phone]  Physical Therapy Treatment [facility] [facility] Note  Patient Details  Name: Sonya Hart MRN: [id number] Date of Birth: 17-Jun-1971 Referring Provider: Harlow Mares   Encounter Date: 03/28/2018    Past Medical History:  Diagnosis Date  . Graves disease    radiation to thyroid 1992    No past surgical history on file.  There were no vitals filed for this visit.  Subjective Assessment - 03/30/18 0729    Subjective  Pt reported she played baseball with his 49 year old nephew. Pt was surprised. Pt felt no pain when playing baseball.   Pt went on a 5 mile walk at a local trail , walked backwards up hill, walked sideways on the bridges. Pt rode horse 2 x  across a weekend with regular routine, and a third time horseback riding with new horse routine that included jumps.  Pt rode last night again. 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 Pt today feels stiff and some noticable soreness after these activities this past weekend.  Pt also did her regular PT and stretches during the past 3 weeks. Pt has adjusted her evening schedule for self care and prioritized charting in the morning instead of the previous evening which is  more mangable  and she feels less drained. Pt also uses a timer when charting to finish within a certain number of hours to minimize exhaustion. During the week prior to her injury, pt tried to try 3 miles every other night on her treadmill,            [facility] PT Assessment - 03/30/18 0729      Single Leg Stance   Comments  L ankle on BOSU step ups with inversion and poor stability, Excessive cues needed                   [facility] Adult PT Treatment/Exercise - 03/30/18 0729      Neuro Re-ed    Neuro Re-ed Details   see pt instructions      Exercises   Exercises  --   see pt  instructions                 PT Long Term Goals - 03/28/18 1707      PT LONG TERM GOAL #1   Title  Pt will demo increased hip strength flexion, abduction, knee extension B 4/5 in order to progress to loaded, upright  exercises to return to fitness routines    Baseline  01/10/18:  hip abd R 4+/5, L 4-/5   , hamstring 3+/5 R, 4-/5 L ,  hip ext L 4-/5, 4/5,  hip flexion L 4/5, R 4+/5._____7/23: hip abd 5/5   , hamstring 3+/5 R, 5/5L ,  hip ext R 4-/5, 5/5,  hip flexion B 5/5___ 8/19: all 5/5 B      Time  4    Period  Weeks    Status  Achieved      PT LONG TERM GOAL #2   Title  Pt will demo decreased R paraspinal/ QL/ pelvic floor tensions and tenderness with less R deviation of lumbar segments at L1-2 across 2 visits in order to restore proper sitting posture and to return to horseback riding     Time  3    Period  Weeks    Status  Achieved      PT LONG TERM GOAL #3   Title  Pt will demo IND with HEP, fitness exercises with weights/ resistance bands to minimize injuries,  and work modifications with sitting, turning at her work station , and lifting her suitcase with proper mechanics     Time  6    Period  Weeks    Status  Partially Met      PT LONG TERM GOAL #4   Title  Pt will decrease her PGQ score 23% to <18% in order to have less pain with sit to stand, lifting  (7/23: 5%)     Time  6    Period  Weeks    Status  Achieved      PT LONG TERM GOAL #5   Title  Pt will demo proper pelvic floor lengthening and contraction Grade 4/5 for 5 reps, 5 sec in order to increase pelvic girdle stability to progress towards horseback riding     Time  8    Period  Weeks    Status  On-going      PT LONG TERM GOAL #6   Title   PSFS score to decrease from 0/10 for  horseback riding,  yoga, running 0/10   (7/23: horseback and yoga 5/10, running 0/10--- 9/17: horseback and yoga 8/10, and running 0/10)  )     Time  12    Status  Partially Met            Plan - 03/30/18 0730     Clinical Impression Statement  Pt has achieved 3/6 goals with no pain, signficantly improved hip/BLE and deep core mm strength, and decreased pelvic floor mm tensions. Pt presents with normal alignment of her spine and pelvis without deviations and balanced paraspinal mm mobility as pt remained compliant with modifying her sitting position at work to minimize repeated R trunk rotation. Pt also has improved on her posture and standing balance. Pt has returned to horseback riding and yoga but is progressing towards running this week.  Focused today on lower kinetic cahin strengthening, dynamic stability, and ankle strengthening to return to running with minimized injuries. Pt continues to benefit from skilled PT to achieve remaining goals.     Rehab Potential  Good    PT Frequency  1x / week    PT Duration  12 weeks    PT Treatment/Interventions  Therapeutic exercise;Therapeutic activities;Patient/family education;Balance training;Gait training;Moist Heat;Traction;Stair training;Neuromuscular re-education;Manual techniques;Scar mobilization    Consulted and Agree with Plan of Care  Patient       Patient will benefit from skilled therapeutic intervention in order to improve the following deficits and impairments:  Improper body mechanics, Pain, Decreased mobility, Decreased coordination, Decreased scar mobility, Increased muscle spasms, Postural dysfunction, Decreased endurance, Decreased activity tolerance, Decreased balance, Decreased strength, Decreased range of motion, Hypomobility  Visit Diagnosis: Myalgia  Sacrococcygeal disorders, not elsewhere classified  Muscle weakness (generalized)  Other idiopathic scoliosis, lumbar region     Problem List There are no active problems to display for this patient.   [name] ,PT, DPT, E-RYT  03/30/2018, 7:34 AM  [place] MAIN [facility] SERVICES [address] [place], [place], [address] Phone:  [phone]   Fax:  [phone]  Name: CAILA CIRELLI MRN: [id number] Date of Birth: 20-Oct-1970

## 2018-04-03 ENCOUNTER — Ambulatory Visit: Payer: 59 | Admitting: Physical Therapy

## 2018-04-12 ENCOUNTER — Ambulatory Visit: Payer: 59 | Admitting: Physical Therapy

## 2018-04-13 DIAGNOSIS — E063 Autoimmune thyroiditis: Secondary | ICD-10-CM | POA: Diagnosis not present

## 2018-04-13 DIAGNOSIS — D249 Benign neoplasm of unspecified breast: Secondary | ICD-10-CM | POA: Diagnosis not present

## 2018-04-13 DIAGNOSIS — Z13820 Encounter for screening for osteoporosis: Secondary | ICD-10-CM | POA: Diagnosis not present

## 2018-04-13 DIAGNOSIS — E0500 Thyrotoxicosis with diffuse goiter without thyrotoxic crisis or storm: Secondary | ICD-10-CM | POA: Diagnosis not present

## 2018-04-13 DIAGNOSIS — L709 Acne, unspecified: Secondary | ICD-10-CM | POA: Diagnosis not present

## 2018-04-13 DIAGNOSIS — M545 Low back pain: Secondary | ICD-10-CM | POA: Diagnosis not present

## 2018-04-13 DIAGNOSIS — E785 Hyperlipidemia, unspecified: Secondary | ICD-10-CM | POA: Diagnosis not present

## 2018-04-13 DIAGNOSIS — E890 Postprocedural hypothyroidism: Secondary | ICD-10-CM | POA: Diagnosis not present

## 2018-04-26 ENCOUNTER — Ambulatory Visit: Payer: 59 | Admitting: Physical Therapy

## 2018-05-02 ENCOUNTER — Ambulatory Visit: Payer: 59 | Attending: Orthopedic Surgery | Admitting: Physical Therapy

## 2018-05-02 DIAGNOSIS — M6281 Muscle weakness (generalized): Secondary | ICD-10-CM | POA: Diagnosis not present

## 2018-05-02 DIAGNOSIS — M791 Myalgia, unspecified site: Secondary | ICD-10-CM | POA: Diagnosis not present

## 2018-05-02 DIAGNOSIS — M533 Sacrococcygeal disorders, not elsewhere classified: Secondary | ICD-10-CM | POA: Insufficient documentation

## 2018-05-02 NOTE — Patient Instructions (Signed)
4 shoulders band:  Rotator cuff ( IR/ ER)  Use one band  serrator anterior  ( Sonya Hart  Band is behind shoulders)  suprainspinatus ( band is behind buttucks, fingers curled towards you)   2 Cross diagonal: Standing PNF band exercises    "Drawing a sword" " Pulling a lawnmover" Band is under fee, hold band in L hand, across body by R pocket  Press downward into the ballmounds and heels of the feet, thigh muscle active, Keep knees and hips squared the front and do not move them throughout the activity, Exhale to pull band from R pocket,  across the face to the R pocket, rotating only your ribcage to L as if "drawing a sword"    10 x 2 reps      "Pulling seat belt"   Band over door knob with knot on the other side of the door Stand with toes, knees, and pelvis turned 45 deg away from the door,  Exhale to pull band from R ear height across body to the L pocket without turning pelvis and knees  Follow hand with eyes/head  10 x 2 reps   ___

## 2018-05-02 NOTE — Therapy (Signed)
[place] MAIN [facility] SERVICES [address] [place], [place], [address] Phone: [phone]   Fax:  [phone]  Physical Therapy Treatment  Patient Details  Name: Sonya Hart MRN: [id number] Date of Birth: [birth year] Referring Provider (PT): Harlow Mares   Encounter Date: 05/02/2018  PT End of Session - 05/02/18 5790    Visit Number  16    Date for PT Re-Evaluation  06/20/18    Authorization Type  re-cert submitted on 3/83     PT Start Time  3383    PT Stop Time  1700    PT Time Calculation (min)  45 min    Activity Tolerance  Patient tolerated treatment well;No increased pain    Behavior During Therapy  WFL for tasks assessed/performed       Past Medical History:  Diagnosis Date  . Graves disease    radiation to thyroid 1992    No past surgical history on file.  There were no vitals filed for this visit.  Subjective Assessment - 05/02/18 1638    Subjective  Pt had no issues with running on the trails, horseriding once a week.          [facility] PT Assessment - 05/02/18 1703      Strength   Overall Strength Comments  hip abd / ext  5/5 B                                 PT Long Term Goals - 03/28/18 1707      PT LONG TERM GOAL #1   Title  Pt will demo increased hip strength flexion, abduction, knee extension B 4/5 in order to progress to loaded, upright  exercises to return to fitness routines    Baseline  01/10/18:  hip abd R 4+/5, L 4-/5   , hamstring 3+/5 R, 4-/5 L ,  hip ext L 4-/5, 4/5,  hip flexion L 4/5, R 4+/5._____7/23: hip abd 5/5   , hamstring 3+/5 R, 5/5L ,  hip ext R 4-/5, 5/5,  hip flexion B 5/5___ 8/19: all 5/5 B      Time  4    Period  Weeks    Status  Achieved      PT LONG TERM GOAL #2   Title  Pt will demo decreased R paraspinal/ QL/ pelvic floor tensions and tenderness with less R deviation of lumbar segments at L1-2 across 2 visits in order to restore proper sitting posture and to  return to horseback riding     Time  3    Period  Weeks    Status  Achieved      PT LONG TERM GOAL #3   Title  Pt will demo IND with HEP, fitness exercises with weights/ resistance bands to minimize injuries,  and work modifications with sitting, turning at her work station , and lifting her suitcase with proper mechanics     Time  6    Period  Weeks    Status  Partially Met      PT LONG TERM GOAL #4   Title  Pt will decrease her PGQ score 23% to <18% in order to have less pain with sit to stand, lifting  (7/23: 5%)     Time  6    Period  Weeks    Status  Achieved      PT LONG TERM GOAL #5  Title  Pt will demo proper pelvic floor lengthening and contraction Grade 4/5 for 5 reps, 5 sec in order to increase pelvic girdle stability to progress towards horseback riding     Time  8    Period  Weeks    Status  On-going      PT LONG TERM GOAL #6   Title   PSFS score to decrease from 0/10 for  horseback riding,  yoga, running 0/10   (7/23: horseback and yoga 5/10, running 0/10--- 9/17: horseback and yoga 8/10, and running 0/10)  )     Time  12    Status  Partially Met            Plan - 05/02/18 1642    Clinical Impression Statement  Pt demo'd optimal strength in hips in abduction/extension and progressed to weight lifting with dumbbells and education on resistance band strengthening for rotator cuff and cross diagonal planes. Pt has returned to running without issues. Pt continues to benefit from skilled PT to advance towards body weight strengthening and further fitness training.     Rehab Potential  Good    PT Frequency  1x / week    PT Duration  12 weeks    PT Treatment/Interventions  Therapeutic exercise;Therapeutic activities;Patient/family education;Balance training;Gait training;Moist Heat;Traction;Stair training;Neuromuscular re-education;Manual techniques;Scar mobilization    Consulted and Agree with Plan of Care  Patient       Patient will benefit from skilled  therapeutic intervention in order to improve the following deficits and impairments:  Improper body mechanics, Pain, Decreased mobility, Decreased coordination, Decreased scar mobility, Increased muscle spasms, Postural dysfunction, Decreased endurance, Decreased activity tolerance, Decreased balance, Decreased strength, Decreased range of motion, Hypomobility  Visit Diagnosis: Myalgia  Sacrococcygeal disorders, not elsewhere classified  Muscle weakness (generalized)     Problem List There are no active problems to display for this patient.   [name] ,PT, DPT, E-RYT  05/02/2018, 5:04 PM  [place] MAIN [facility] SERVICES [address] [place], [place], [address] Phone: [phone]   Fax:  [phone]  Name: Sonya Hart MRN: [id number] Date of Birth: Dec 04, 1970

## 2018-05-10 ENCOUNTER — Encounter: Payer: 59 | Admitting: Physical Therapy

## 2018-05-24 ENCOUNTER — Ambulatory Visit: Payer: 59 | Attending: Orthopedic Surgery | Admitting: Physical Therapy

## 2018-05-24 DIAGNOSIS — M4126 Other idiopathic scoliosis, lumbar region: Secondary | ICD-10-CM | POA: Insufficient documentation

## 2018-05-24 DIAGNOSIS — M791 Myalgia, unspecified site: Secondary | ICD-10-CM | POA: Insufficient documentation

## 2018-05-24 DIAGNOSIS — M6281 Muscle weakness (generalized): Secondary | ICD-10-CM | POA: Insufficient documentation

## 2018-05-24 DIAGNOSIS — M533 Sacrococcygeal disorders, not elsewhere classified: Secondary | ICD-10-CM | POA: Insufficient documentation

## 2018-06-07 ENCOUNTER — Ambulatory Visit: Payer: 59 | Admitting: Physical Therapy

## 2018-06-07 DIAGNOSIS — M6281 Muscle weakness (generalized): Secondary | ICD-10-CM

## 2018-06-07 DIAGNOSIS — M791 Myalgia, unspecified site: Secondary | ICD-10-CM

## 2018-06-07 DIAGNOSIS — M4126 Other idiopathic scoliosis, lumbar region: Secondary | ICD-10-CM

## 2018-06-07 DIAGNOSIS — M533 Sacrococcygeal disorders, not elsewhere classified: Secondary | ICD-10-CM

## 2018-06-07 NOTE — Therapy (Signed)
[place] MAIN [facility] SERVICES [address] [place], [place], [address] Phone: [phone]   Fax:  [phone]  Patient Details  Name: Sonya Hart MRN: [id number] Date of Birth: 10-09-1970 Referring Provider:  Lovell Sheehan, MD  Encounter Date: 06/07/2018  Discharge Summary   Pt has achieved 100% of her goals with complete return to running, horseback riding, and practicing yoga without pain 2/2/ pelvic Fx that occurred 6 months ago.  Pt has demo'd increased hip strength and proper lower kinetic chain co-activation with proper alignment to minimize relapse of Sx. Pt is ready for d/c.    PT Long Term Goals - 06/07/18 1608      PT LONG TERM GOAL #1   Title  Pt will demo increased hip strength flexion, abduction, knee extension B 4/5 in order to progress to loaded, upright  exercises to return to fitness routines    Baseline  01/10/18:  hip abd R 4+/5, L 4-/5   , hamstring 3+/5 R, 4-/5 L ,  hip ext L 4-/5, 4/5,  hip flexion L 4/5, R 4+/5._____7/23: hip abd 5/5   , hamstring 3+/5 R, 5/5L ,  hip ext R 4-/5, 5/5,  hip flexion B 5/5___ 8/19: all 5/5 B      Time  4    Period  Weeks    Status  Achieved      PT LONG TERM GOAL #2   Title  Pt will demo decreased R paraspinal/ QL/ pelvic floor tensions and tenderness with less R deviation of lumbar segments at L1-2 across 2 visits in order to restore proper sitting posture and to return to horseback riding     Time  3    Period  Weeks    Status  Achieved      PT LONG TERM GOAL #3   Title  Pt will demo IND with HEP, fitness exercises with weights/ resistance bands to minimize injuries,  and work modifications with sitting, turning at her work station , and lifting her suitcase with proper mechanics     Time  6    Period  Weeks    Status  Achieved      PT LONG TERM GOAL #4   Title  Pt will decrease her PGQ score 23% to <18% in order to have less pain with sit to stand, lifting  (7/23: 5%)     Time   6    Period  Weeks    Status  Achieved      PT LONG TERM GOAL #5   Title  Pt will demo proper pelvic floor lengthening and contraction Grade 4/5 for 5 reps, 5 sec in order to increase pelvic girdle stability to progress towards horseback riding     Time  8    Period  Weeks    Status  Achieved      PT LONG TERM GOAL #6   Title   PSFS score to decrease from 0/10 for  horseback riding,  yoga, running 0/10   (7/23: horseback and yoga 5/10, running 0/10--- 9/17: horseback and yoga 8/10, and running 0/10--- 11/27: horseback, yoga, running 10/10)  )     Time  12    Status  Achieved         [name] ,PT, DPT, E-RYT  06/07/2018, 4:47 PM  [place], [place], [address] Phone: [phone]   Fax:  [phone]

## 2018-06-26 DIAGNOSIS — Z872 Personal history of diseases of the skin and subcutaneous tissue: Secondary | ICD-10-CM | POA: Diagnosis not present

## 2018-06-26 DIAGNOSIS — D1800 Hemangioma unspecified site: Secondary | ICD-10-CM | POA: Diagnosis not present

## 2018-06-26 DIAGNOSIS — L578 Other skin changes due to chronic exposure to nonionizing radiation: Secondary | ICD-10-CM | POA: Diagnosis not present

## 2018-06-26 DIAGNOSIS — Z86018 Personal history of other benign neoplasm: Secondary | ICD-10-CM | POA: Diagnosis not present

## 2018-06-29 DIAGNOSIS — Z1231 Encounter for screening mammogram for malignant neoplasm of breast: Secondary | ICD-10-CM | POA: Diagnosis not present

## 2018-06-29 DIAGNOSIS — Z6821 Body mass index (BMI) 21.0-21.9, adult: Secondary | ICD-10-CM | POA: Diagnosis not present

## 2018-06-29 DIAGNOSIS — Z01419 Encounter for gynecological examination (general) (routine) without abnormal findings: Secondary | ICD-10-CM | POA: Diagnosis not present

## 2018-06-30 DIAGNOSIS — Z800 Family history of malignant neoplasm of digestive organs: Secondary | ICD-10-CM | POA: Diagnosis not present

## 2018-06-30 DIAGNOSIS — Z803 Family history of malignant neoplasm of breast: Secondary | ICD-10-CM | POA: Diagnosis not present

## 2018-06-30 DIAGNOSIS — Z808 Family history of malignant neoplasm of other organs or systems: Secondary | ICD-10-CM | POA: Diagnosis not present

## 2018-08-10 DIAGNOSIS — Z09 Encounter for follow-up examination after completed treatment for conditions other than malignant neoplasm: Secondary | ICD-10-CM | POA: Diagnosis not present

## 2018-10-11 DIAGNOSIS — E785 Hyperlipidemia, unspecified: Secondary | ICD-10-CM | POA: Diagnosis not present

## 2018-10-11 DIAGNOSIS — E063 Autoimmune thyroiditis: Secondary | ICD-10-CM | POA: Diagnosis not present

## 2018-10-11 DIAGNOSIS — E0500 Thyrotoxicosis with diffuse goiter without thyrotoxic crisis or storm: Secondary | ICD-10-CM | POA: Diagnosis not present

## 2018-10-11 DIAGNOSIS — D249 Benign neoplasm of unspecified breast: Secondary | ICD-10-CM | POA: Diagnosis not present

## 2018-10-11 DIAGNOSIS — E890 Postprocedural hypothyroidism: Secondary | ICD-10-CM | POA: Diagnosis not present

## 2018-10-11 DIAGNOSIS — Z1321 Encounter for screening for nutritional disorder: Secondary | ICD-10-CM | POA: Diagnosis not present

## 2018-10-11 DIAGNOSIS — L709 Acne, unspecified: Secondary | ICD-10-CM | POA: Diagnosis not present

## 2018-10-20 DIAGNOSIS — D249 Benign neoplasm of unspecified breast: Secondary | ICD-10-CM | POA: Diagnosis not present

## 2018-10-20 DIAGNOSIS — L709 Acne, unspecified: Secondary | ICD-10-CM | POA: Diagnosis not present

## 2018-10-20 DIAGNOSIS — Z23 Encounter for immunization: Secondary | ICD-10-CM | POA: Diagnosis not present

## 2018-10-20 DIAGNOSIS — E890 Postprocedural hypothyroidism: Secondary | ICD-10-CM | POA: Diagnosis not present

## 2018-10-20 DIAGNOSIS — E063 Autoimmune thyroiditis: Secondary | ICD-10-CM | POA: Diagnosis not present

## 2018-10-20 DIAGNOSIS — E785 Hyperlipidemia, unspecified: Secondary | ICD-10-CM | POA: Diagnosis not present

## 2018-10-20 DIAGNOSIS — E0500 Thyrotoxicosis with diffuse goiter without thyrotoxic crisis or storm: Secondary | ICD-10-CM | POA: Diagnosis not present

## 2018-10-20 DIAGNOSIS — M545 Low back pain: Secondary | ICD-10-CM | POA: Diagnosis not present

## 2019-05-23 DIAGNOSIS — H5213 Myopia, bilateral: Secondary | ICD-10-CM | POA: Diagnosis not present

## 2019-06-24 IMAGING — CR DG LUMBAR SPINE COMPLETE 4+V
5 series · 5 of 5 positions shown · non-contrast
Comparison: None

CLINICAL DATA: Bucked off a horse today at 1144 hours, having RIGHT
leg, hip and back pain, initial encounter

EXAM:
LUMBAR SPINE - COMPLETE 4+ VIEW

[l-spine ap]
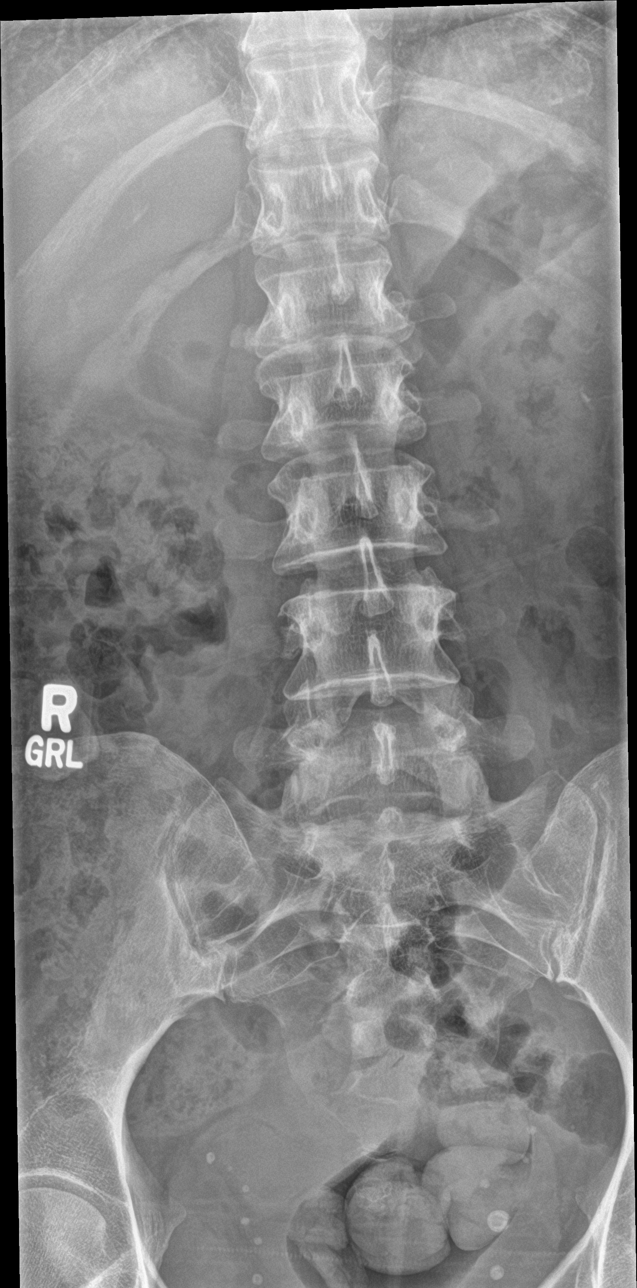

[l-spine obl (1 of 2)]
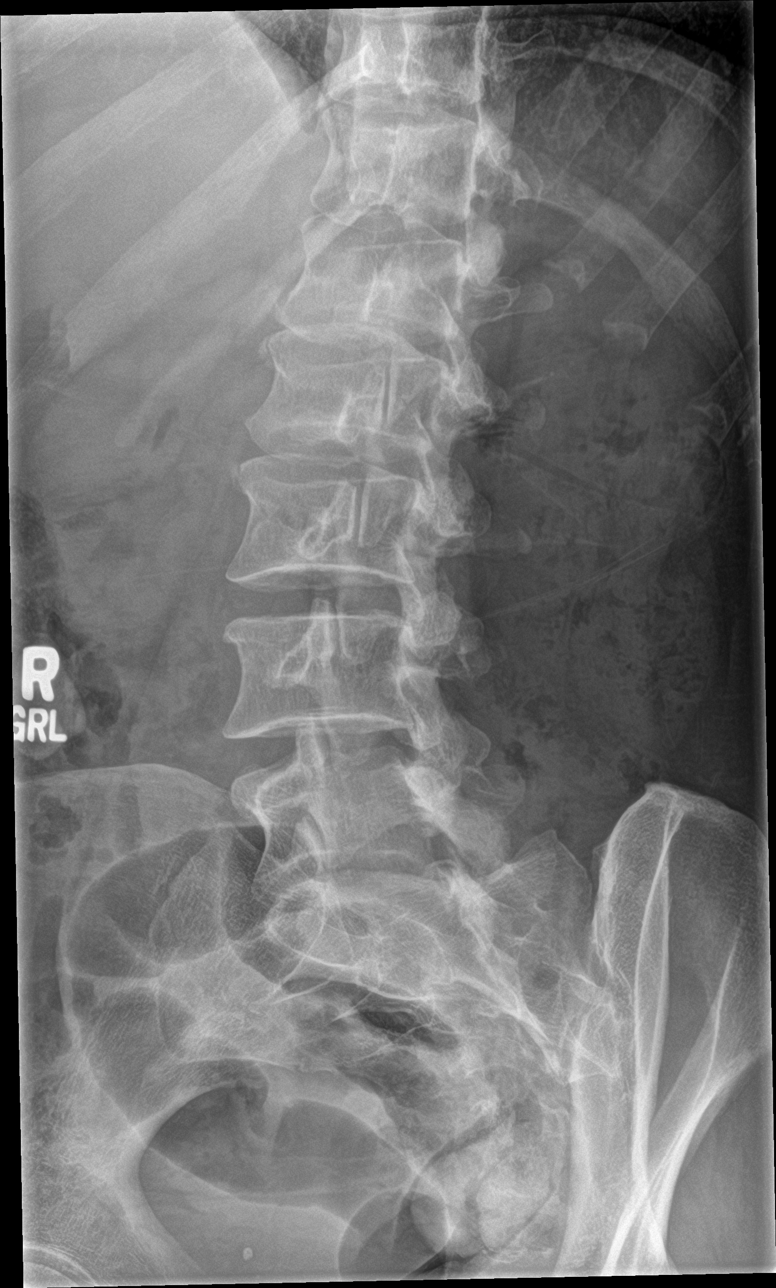

[l-spine obl (2 of 2)]
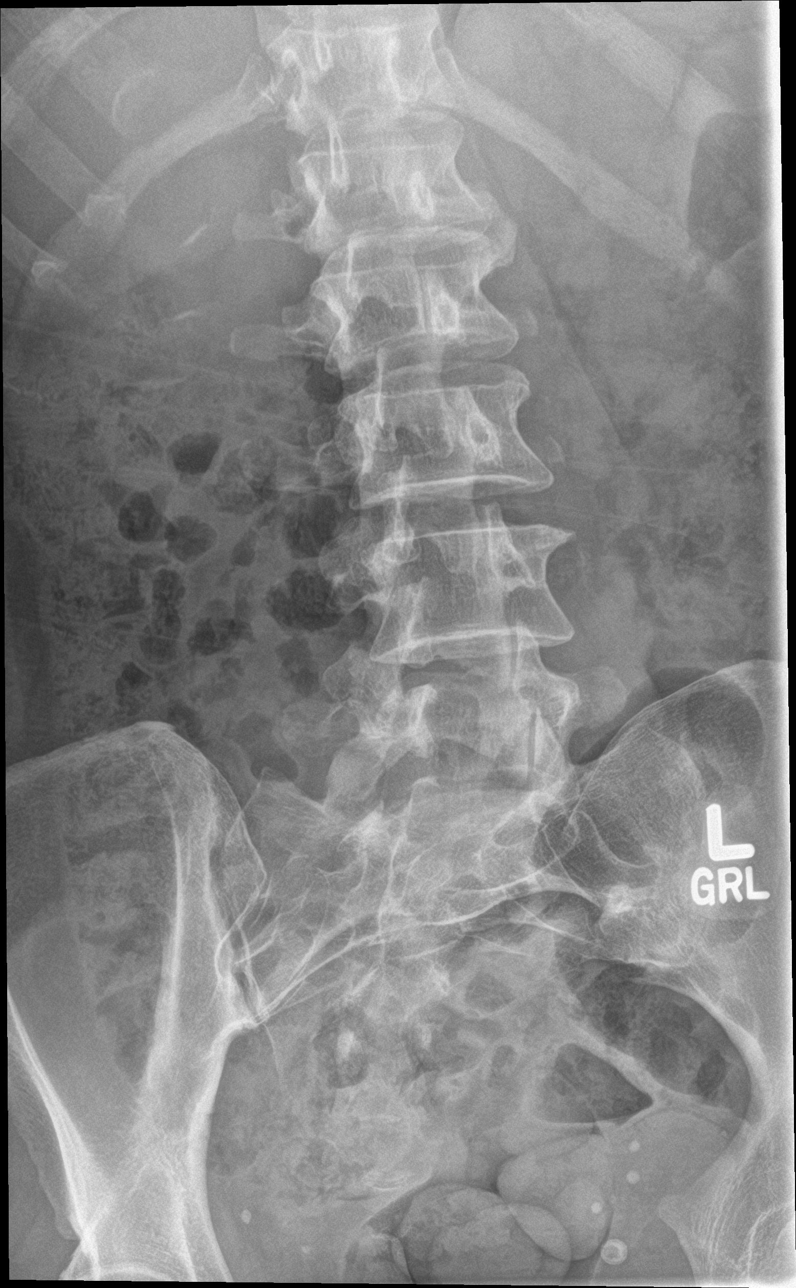

[l-spine lat]
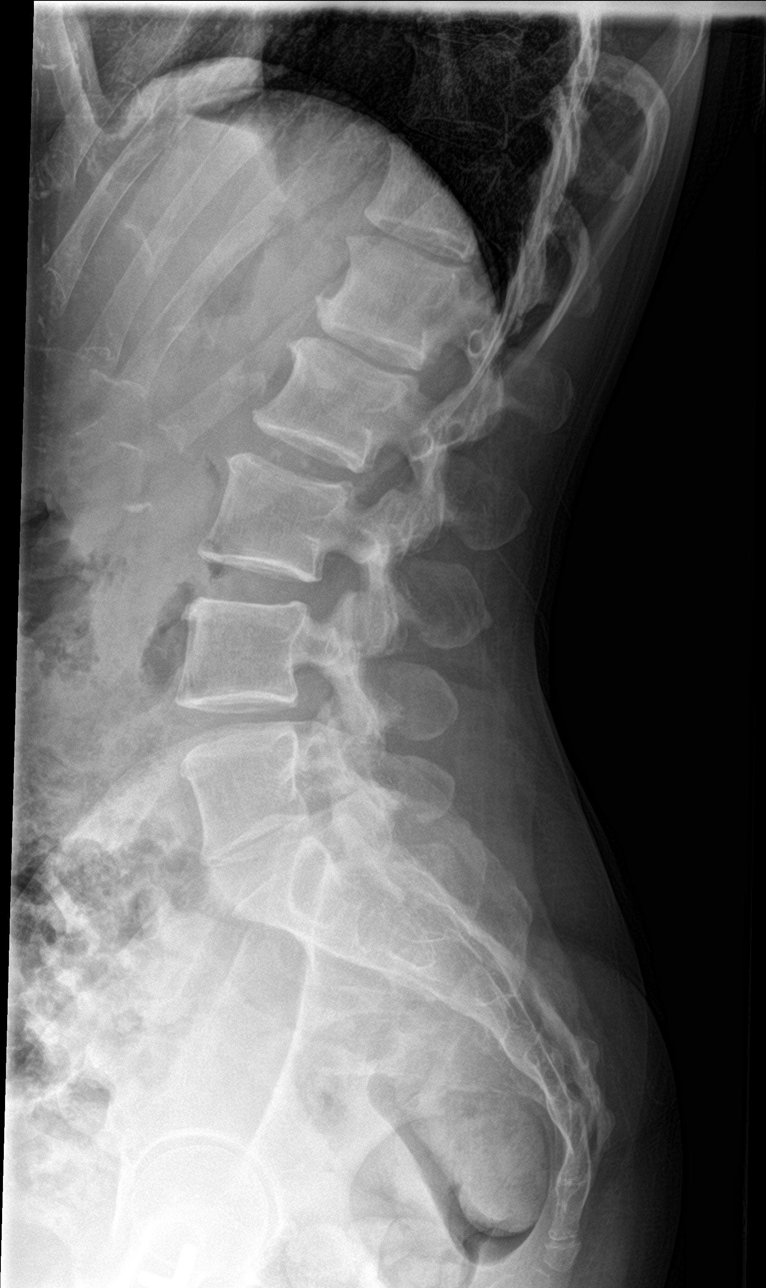

[l-spine spot]
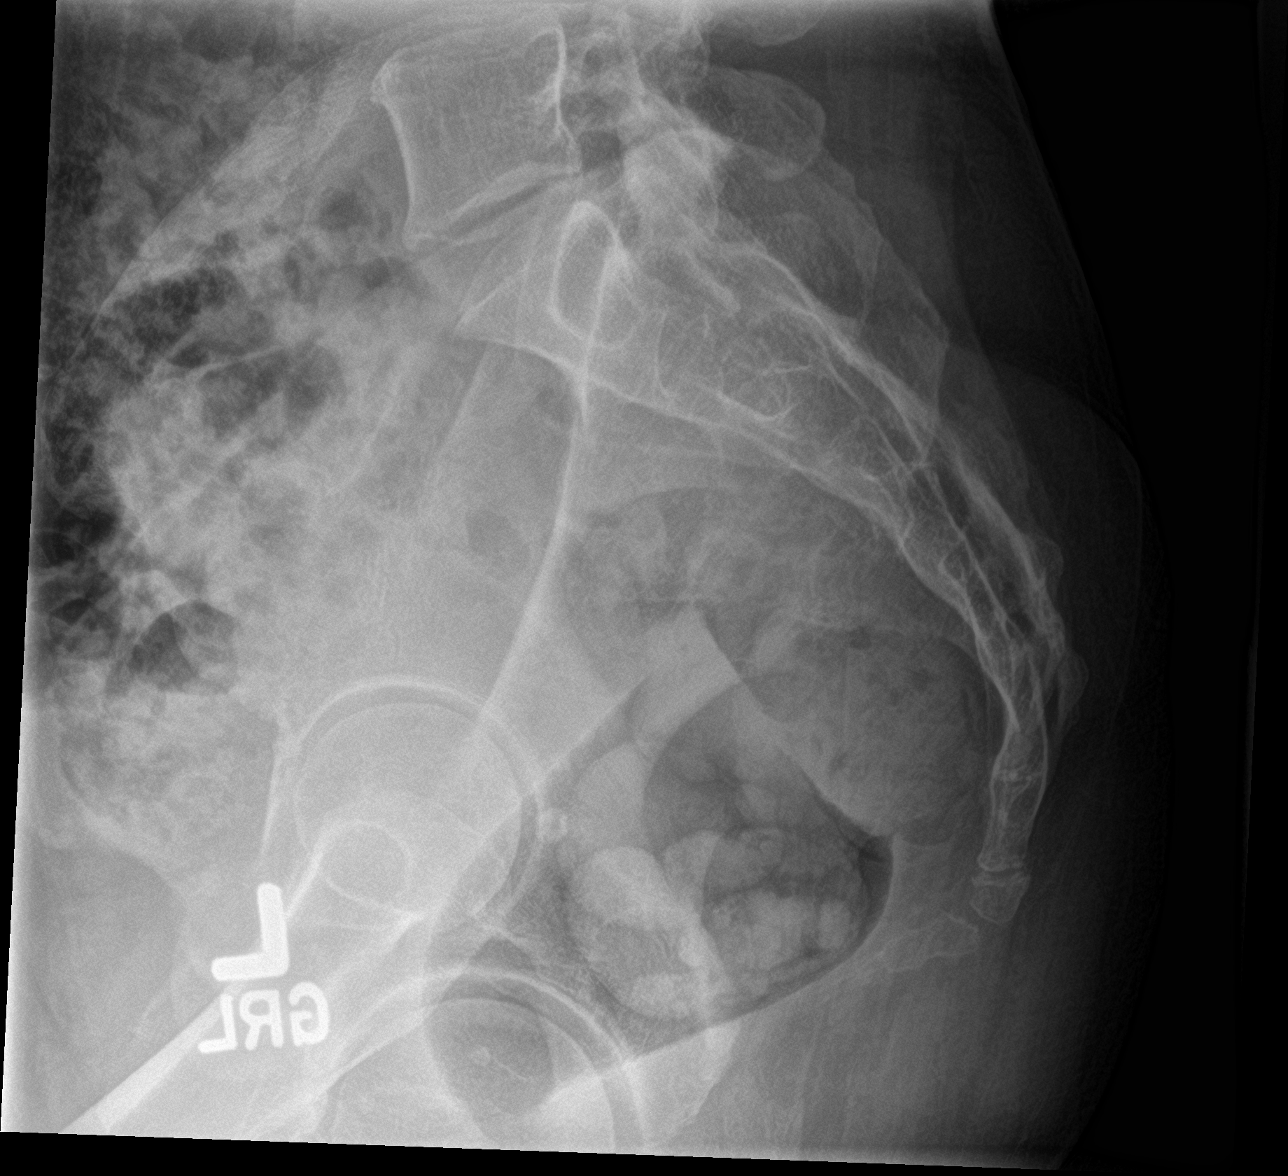

[5 of 5 positions shown; findings below may reference images not displayed]

FINDINGS: Osseous mineralization normal.

Five non-rib-bearing lumbar vertebra.

Slight disc space narrowing and endplate spur formation at L1-L2.

Vertebral body and disc space heights otherwise maintained.

No acute fracture, subluxation or bone destruction.

SI joints symmetric.

Scattered pelvic phleboliths.
IMPRESSION: Mild degenerative disc disease changes at L1-L2.

No acute bony abnormalities.

## 2019-06-25 DIAGNOSIS — Z872 Personal history of diseases of the skin and subcutaneous tissue: Secondary | ICD-10-CM | POA: Diagnosis not present

## 2019-06-25 DIAGNOSIS — L578 Other skin changes due to chronic exposure to nonionizing radiation: Secondary | ICD-10-CM | POA: Diagnosis not present

## 2019-06-25 DIAGNOSIS — Z86018 Personal history of other benign neoplasm: Secondary | ICD-10-CM | POA: Diagnosis not present

## 2019-10-03 DIAGNOSIS — Z1231 Encounter for screening mammogram for malignant neoplasm of breast: Secondary | ICD-10-CM | POA: Diagnosis not present

## 2019-10-03 DIAGNOSIS — Z01419 Encounter for gynecological examination (general) (routine) without abnormal findings: Secondary | ICD-10-CM | POA: Diagnosis not present

## 2019-10-03 DIAGNOSIS — Z6820 Body mass index (BMI) 20.0-20.9, adult: Secondary | ICD-10-CM | POA: Diagnosis not present

## 2019-10-11 DIAGNOSIS — E785 Hyperlipidemia, unspecified: Secondary | ICD-10-CM | POA: Diagnosis not present

## 2019-10-11 DIAGNOSIS — E063 Autoimmune thyroiditis: Secondary | ICD-10-CM | POA: Diagnosis not present

## 2019-10-11 DIAGNOSIS — E0500 Thyrotoxicosis with diffuse goiter without thyrotoxic crisis or storm: Secondary | ICD-10-CM | POA: Diagnosis not present

## 2019-10-11 DIAGNOSIS — E890 Postprocedural hypothyroidism: Secondary | ICD-10-CM | POA: Diagnosis not present

## 2019-10-11 DIAGNOSIS — L709 Acne, unspecified: Secondary | ICD-10-CM | POA: Diagnosis not present

## 2019-10-11 DIAGNOSIS — D249 Benign neoplasm of unspecified breast: Secondary | ICD-10-CM | POA: Diagnosis not present

## 2019-10-15 ENCOUNTER — Encounter: Payer: Self-pay | Admitting: Gastroenterology

## 2019-10-19 ENCOUNTER — Other Ambulatory Visit: Payer: Self-pay | Admitting: Endocrinology

## 2019-10-19 DIAGNOSIS — E890 Postprocedural hypothyroidism: Secondary | ICD-10-CM | POA: Diagnosis not present

## 2019-10-19 DIAGNOSIS — E785 Hyperlipidemia, unspecified: Secondary | ICD-10-CM | POA: Diagnosis not present

## 2019-10-19 DIAGNOSIS — Z23 Encounter for immunization: Secondary | ICD-10-CM | POA: Diagnosis not present

## 2019-10-19 DIAGNOSIS — D249 Benign neoplasm of unspecified breast: Secondary | ICD-10-CM | POA: Diagnosis not present

## 2019-10-19 DIAGNOSIS — M545 Low back pain: Secondary | ICD-10-CM | POA: Diagnosis not present

## 2019-10-19 DIAGNOSIS — E063 Autoimmune thyroiditis: Secondary | ICD-10-CM | POA: Diagnosis not present

## 2019-10-19 DIAGNOSIS — E0500 Thyrotoxicosis with diffuse goiter without thyrotoxic crisis or storm: Secondary | ICD-10-CM | POA: Diagnosis not present

## 2019-10-19 DIAGNOSIS — L709 Acne, unspecified: Secondary | ICD-10-CM | POA: Diagnosis not present

## 2019-11-06 ENCOUNTER — Other Ambulatory Visit: Payer: Self-pay

## 2019-11-06 ENCOUNTER — Ambulatory Visit (AMBULATORY_SURGERY_CENTER): Payer: Self-pay | Admitting: *Deleted

## 2019-11-06 VITALS — Temp 97.5°F | Ht 62.75 in | Wt 111.0 lb

## 2019-11-06 DIAGNOSIS — Z1211 Encounter for screening for malignant neoplasm of colon: Secondary | ICD-10-CM

## 2019-11-06 MED ORDER — SUPREP BOWEL PREP KIT 17.5-3.13-1.6 GM/177ML PO SOLN: 1.0000 | Freq: Once | ORAL | 0 refills | Status: AC

## 2019-11-06 NOTE — Progress Notes (Signed)
No egg or soy allergy known to patient  No issues with past sedation with any surgeries  or procedures, no intubation problems  No diet pills per patient No home 02 use per patient  No blood thinners per patient  Pt denies issues with constipation  No A fib or A flutter  EMMI video sent to pt's e mail   07-2019 completed covid vaccines - prob mid jan per pt   Due to the COVID-19 pandemic we are asking patients to follow these guidelines. Please only bring one care partner. Please be aware that your care partner may wait in the car in the parking lot or if they feel like they will be too hot to wait in the car, they may wait in the lobby on the 4th floor. All care partners are required to wear a mask the entire time (we do not have any that we can provide them), they need to practice social distancing, and we will do a Covid check for all patient's and care partners when you arrive. Also we will check their temperature and your temperature. If the care partner waits in their car they need to stay in the parking lot the entire time and we will call them on their cell phone when the patient is ready for discharge so they can bring the car to the front of the building. Also all patient's will need to wear a mask into building.

## 2019-11-15 ENCOUNTER — Encounter: Payer: Self-pay | Admitting: Gastroenterology

## 2019-11-19 ENCOUNTER — Other Ambulatory Visit: Payer: Self-pay

## 2019-11-19 ENCOUNTER — Ambulatory Visit (AMBULATORY_SURGERY_CENTER): Payer: 59 | Admitting: Gastroenterology

## 2019-11-19 ENCOUNTER — Encounter: Payer: Self-pay | Admitting: Gastroenterology

## 2019-11-19 VITALS — BP 122/74 | HR 91 | Temp 96.6°F | Resp 16 | Ht 62.0 in | Wt 111.0 lb

## 2019-11-19 DIAGNOSIS — Z1211 Encounter for screening for malignant neoplasm of colon: Secondary | ICD-10-CM | POA: Diagnosis not present

## 2019-11-19 DIAGNOSIS — D128 Benign neoplasm of rectum: Secondary | ICD-10-CM

## 2019-11-19 DIAGNOSIS — K621 Rectal polyp: Secondary | ICD-10-CM

## 2019-11-19 MED ORDER — SODIUM CHLORIDE 0.9 % IV SOLN: 500.0000 mL | Freq: Once | INTRAVENOUS | Status: DC

## 2019-11-19 NOTE — Op Note (Signed)
[place] Patient Name: Sonya Hart Procedure Date: 11/19/2019 9:44 AM MRN: [id number] Endoscopist: Mauri Pole , MD Age: 49 Referring MD:  Date of Birth: 07/24/1970 Gender: Female Account #: [id number] Procedure:                Colonoscopy Indications:              Screening for colorectal malignant neoplasm Medicines:                Monitored Anesthesia Care Procedure:                Pre-Anesthesia Assessment:                           - Prior to the procedure, a History and Physical                            was performed, and patient medications and                            allergies were reviewed. The patient's tolerance of                            previous anesthesia was also reviewed. The risks                            and benefits of the procedure and the sedation                            options and risks were discussed with the patient.                            All questions were answered, and informed consent                            was obtained. Prior Anticoagulants: The patient has                            taken no previous anticoagulant or antiplatelet                            agents. ASA Grade Assessment: II - A patient with                            mild systemic disease. After reviewing the risks                            and benefits, the patient was deemed in                            satisfactory condition to undergo the procedure.                           After obtaining informed consent, the colonoscope  was passed under direct vision. Throughout the                            procedure, the patient's blood pressure, pulse, and                            oxygen saturations were monitored continuously. The                            Colonoscope was introduced through the anus and                            advanced to the the cecum, identified by                            appendiceal orifice  and ileocecal valve. The                            colonoscopy was performed without difficulty. The                            patient tolerated the procedure well. The quality                            of the bowel preparation was excellent. The                            ileocecal valve, appendiceal orifice, and rectum                            were photographed. Scope In: 9:50:07 AM Scope Out: 10:07:39 AM Scope Withdrawal Time: 0 hours 9 minutes 55 seconds  Total Procedure Duration: 0 hours 17 minutes 32 seconds  Findings:                 The perianal and digital rectal examinations were                            normal.                           Two sessile polyps were found in the rectum. The                            polyps were 1 to 2 mm in size. These polyps were                            removed with a cold biopsy forceps. Resection and                            retrieval were complete.                           Non-bleeding internal hemorrhoids were found during  retroflexion. The hemorrhoids were small. Complications:            No immediate complications. Estimated Blood Loss:     Estimated blood loss was minimal. Impression:               - Two 1 to 2 mm polyps in the rectum, removed with                            a cold biopsy forceps. Resected and retrieved.                           - Non-bleeding internal hemorrhoids. Recommendation:           - Patient has a contact number available for                            emergencies. The signs and symptoms of potential                            delayed complications were discussed with the                            patient. Return to normal activities tomorrow.                            Written discharge instructions were provided to the                            patient.                           - Resume previous diet.                           - Continue present medications.                            - Await pathology results.                           - Repeat colonoscopy in 5-10 years for surveillance                            based on pathology results. Mauri Pole, MD 11/19/2019 10:12:20 AM This report has been signed electronically.

## 2019-11-19 NOTE — Patient Instructions (Addendum)
YOU HAD AN ENDOSCOPIC PROCEDURE TODAY AT [facility]:   Refer to the procedure report that was given to you for any specific questions about what was found during the examination.  If the procedure report does not answer your questions, please call your gastroenterologist to clarify.  If you requested that your care partner not be given the details of your procedure findings, then the procedure report has been included in a sealed envelope for you to review at your convenience later.  YOU SHOULD EXPECT: Some feelings of bloating in the abdomen. Passage of more gas than usual.  Walking can help get rid of the air that was put into your GI tract during the procedure and reduce the bloating. If you had a lower endoscopy (such as a colonoscopy or flexible sigmoidoscopy) you may notice spotting of blood in your stool or on the toilet paper. If you underwent a bowel prep for your procedure, you may not have a normal bowel movement for a few days.  Please Note:  You might notice some irritation and congestion in your nose or some drainage.  This is from the oxygen used during your procedure.  There is no need for concern and it should clear up in a day or so.  SYMPTOMS TO REPORT IMMEDIATELY:   Following lower endoscopy (colonoscopy or flexible sigmoidoscopy):  Excessive amounts of blood in the stool  Significant tenderness or worsening of abdominal pains  Swelling of the abdomen that is new, acute  Fever of 100F or higher    For urgent or emergent issues, a gastroenterologist can be reached at any hour by calling [phone]. Do not use MyChart messaging for urgent concerns.    DIET:  We do recommend a small meal at first, but then you may proceed to your regular diet.  Drink plenty of fluids but you should avoid alcoholic beverages for 24 hours.  ACTIVITY:  You should plan to take it easy for the rest of today and you should NOT DRIVE or use heavy machinery until tomorrow  (because of the sedation medicines used during the test).    FOLLOW UP: Our staff will call the number listed on your records 48-72 hours following your procedure to check on you and address any questions or concerns that you may have regarding the information given to you following your procedure. If we do not reach you, we will leave a message.  We will attempt to reach you two times.  During this call, we will ask if you have developed any symptoms of COVID 19. If you develop any symptoms (ie: fever, flu-like symptoms, shortness of breath, cough etc.) before then, please call [phone].  If you test positive for Covid 19 in the 2 weeks post procedure, please call and report this information to [place].    If any biopsies were taken you will be contacted by phone or by letter within the next 1-3 weeks.  Please call us at [phone] if you have not heard about the biopsies in 3 weeks.    SIGNATURES/CONFIDENTIALITY: You and/or your care partner have signed paperwork which will be entered into your electronic medical record.  These signatures attest to the fact that that the information above on your After Visit Summary has been reviewed and is understood.  Full responsibility of the confidentiality of this discharge information lies with you and/or your care-partner.     Handouts were given to you on polyps and hemorrhoids. You may resume your  current medications today. Await biopsy results. Please call if any questions or concerns.

## 2019-11-19 NOTE — Progress Notes (Signed)
Report given to PACU, vss 

## 2019-11-19 NOTE — Progress Notes (Signed)
No problems noted in the recovery room. maw 

## 2019-11-19 NOTE — Progress Notes (Signed)
Pt's states no medical or surgical changes since previsit or office visit.  [place]

## 2019-11-21 ENCOUNTER — Telehealth: Payer: Self-pay

## 2019-11-21 NOTE — Telephone Encounter (Signed)
  Follow up Call-  Call back number 11/19/2019  Post procedure Call Back phone  # [phone]  Permission to leave phone message Yes  Some recent data might be hidden     Patient questions:  Do you have a fever, pain , or abdominal swelling? No. Pain Score  0 *  Have you tolerated food without any problems? Yes.    Have you been able to return to your normal activities? Yes.    Do you have any questions about your discharge instructions: Diet   No. Medications  No. Follow up visit  No.  Do you have questions or concerns about your Care? No.  Actions: * If pain score is 4 or above: No action needed, pain <4.  1. Have you developed a fever since your procedure? No  2.   Have you had an respiratory symptoms (SOB or cough) since your procedure? No 3.   Have you tested positive for COVID 19 since your procedure No  4.   Have you had any family members/close contacts diagnosed with the COVID 19 since your procedure?  No  If yes to any of these questions please route to Joylene John, RN and Erenest Rasher, RN

## 2019-11-29 ENCOUNTER — Encounter: Payer: Self-pay | Admitting: Gastroenterology

## 2019-12-21 DIAGNOSIS — E890 Postprocedural hypothyroidism: Secondary | ICD-10-CM | POA: Diagnosis not present

## 2019-12-21 DIAGNOSIS — E0500 Thyrotoxicosis with diffuse goiter without thyrotoxic crisis or storm: Secondary | ICD-10-CM | POA: Diagnosis not present

## 2019-12-21 DIAGNOSIS — E785 Hyperlipidemia, unspecified: Secondary | ICD-10-CM | POA: Diagnosis not present

## 2019-12-21 DIAGNOSIS — Z23 Encounter for immunization: Secondary | ICD-10-CM | POA: Diagnosis not present

## 2020-04-11 DIAGNOSIS — Z23 Encounter for immunization: Secondary | ICD-10-CM | POA: Diagnosis not present

## 2020-06-30 DIAGNOSIS — Z86018 Personal history of other benign neoplasm: Secondary | ICD-10-CM | POA: Diagnosis not present

## 2020-06-30 DIAGNOSIS — Z872 Personal history of diseases of the skin and subcutaneous tissue: Secondary | ICD-10-CM | POA: Diagnosis not present

## 2020-06-30 DIAGNOSIS — L578 Other skin changes due to chronic exposure to nonionizing radiation: Secondary | ICD-10-CM | POA: Diagnosis not present

## 2020-10-17 DIAGNOSIS — E0500 Thyrotoxicosis with diffuse goiter without thyrotoxic crisis or storm: Secondary | ICD-10-CM | POA: Diagnosis not present

## 2020-10-17 DIAGNOSIS — E890 Postprocedural hypothyroidism: Secondary | ICD-10-CM | POA: Diagnosis not present

## 2020-10-17 DIAGNOSIS — E785 Hyperlipidemia, unspecified: Secondary | ICD-10-CM | POA: Diagnosis not present

## 2020-10-17 DIAGNOSIS — E063 Autoimmune thyroiditis: Secondary | ICD-10-CM | POA: Diagnosis not present

## 2020-10-20 ENCOUNTER — Other Ambulatory Visit: Payer: Self-pay | Admitting: Endocrinology

## 2020-10-21 ENCOUNTER — Other Ambulatory Visit: Payer: Self-pay

## 2020-10-24 ENCOUNTER — Other Ambulatory Visit: Payer: Self-pay

## 2020-10-28 ENCOUNTER — Other Ambulatory Visit: Payer: Self-pay

## 2020-10-28 DIAGNOSIS — E890 Postprocedural hypothyroidism: Secondary | ICD-10-CM | POA: Diagnosis not present

## 2020-10-28 DIAGNOSIS — E785 Hyperlipidemia, unspecified: Secondary | ICD-10-CM | POA: Diagnosis not present

## 2020-10-28 DIAGNOSIS — E0500 Thyrotoxicosis with diffuse goiter without thyrotoxic crisis or storm: Secondary | ICD-10-CM | POA: Diagnosis not present

## 2020-10-28 DIAGNOSIS — E063 Autoimmune thyroiditis: Secondary | ICD-10-CM | POA: Diagnosis not present

## 2020-10-28 DIAGNOSIS — D249 Benign neoplasm of unspecified breast: Secondary | ICD-10-CM | POA: Diagnosis not present

## 2020-10-28 DIAGNOSIS — H5213 Myopia, bilateral: Secondary | ICD-10-CM | POA: Diagnosis not present

## 2020-10-28 DIAGNOSIS — L709 Acne, unspecified: Secondary | ICD-10-CM | POA: Diagnosis not present

## 2020-10-29 ENCOUNTER — Other Ambulatory Visit: Payer: Self-pay | Admitting: Endocrinology

## 2020-10-29 ENCOUNTER — Other Ambulatory Visit: Payer: Self-pay

## 2020-10-29 MED ORDER — LEVOTHYROXINE SODIUM 75 MCG PO TABS
ORAL_TABLET | ORAL | 3 refills | Status: DC
  Filled 2020-10-29 – 2020-11-24 (×2): qty 90, 90d supply, fill #0
  Filled 2021-02-27: qty 90, 90d supply, fill #1
  Filled 2021-06-08: qty 90, 90d supply, fill #2
  Filled 2021-09-16: qty 90, 90d supply, fill #3

## 2020-10-29 MED ORDER — SYNTHROID 75 MCG PO TABS
ORAL_TABLET | ORAL | 0 refills | Status: AC
  Filled 2020-10-29: qty 30, 30d supply, fill #0

## 2020-10-31 ENCOUNTER — Ambulatory Visit: Payer: 59 | Attending: Internal Medicine

## 2020-10-31 ENCOUNTER — Other Ambulatory Visit: Payer: Self-pay

## 2020-10-31 DIAGNOSIS — Z23 Encounter for immunization: Secondary | ICD-10-CM

## 2020-11-04 ENCOUNTER — Other Ambulatory Visit: Payer: Self-pay

## 2020-11-04 MED ORDER — PFIZER-BIONT COVID-19 VAC-TRIS 30 MCG/0.3ML IM SUSP
INTRAMUSCULAR | 0 refills | Status: AC
  Filled 2020-11-04: qty 0.3, 15d supply, fill #0

## 2020-11-05 ENCOUNTER — Other Ambulatory Visit: Payer: Self-pay

## 2020-11-24 ENCOUNTER — Other Ambulatory Visit: Payer: Self-pay

## 2020-11-24 DIAGNOSIS — Z01419 Encounter for gynecological examination (general) (routine) without abnormal findings: Secondary | ICD-10-CM | POA: Diagnosis not present

## 2020-11-24 DIAGNOSIS — Z6820 Body mass index (BMI) 20.0-20.9, adult: Secondary | ICD-10-CM | POA: Diagnosis not present

## 2020-11-24 DIAGNOSIS — Z1231 Encounter for screening mammogram for malignant neoplasm of breast: Secondary | ICD-10-CM | POA: Diagnosis not present

## 2020-11-25 ENCOUNTER — Other Ambulatory Visit: Payer: Self-pay

## 2021-03-02 ENCOUNTER — Other Ambulatory Visit: Payer: Self-pay

## 2021-03-03 ENCOUNTER — Other Ambulatory Visit: Payer: Self-pay

## 2021-06-08 ENCOUNTER — Other Ambulatory Visit: Payer: Self-pay

## 2021-06-22 DIAGNOSIS — L578 Other skin changes due to chronic exposure to nonionizing radiation: Secondary | ICD-10-CM | POA: Diagnosis not present

## 2021-06-22 DIAGNOSIS — Z86018 Personal history of other benign neoplasm: Secondary | ICD-10-CM | POA: Diagnosis not present

## 2021-06-22 DIAGNOSIS — Z872 Personal history of diseases of the skin and subcutaneous tissue: Secondary | ICD-10-CM | POA: Diagnosis not present

## 2021-08-26 DIAGNOSIS — F4322 Adjustment disorder with anxiety: Secondary | ICD-10-CM | POA: Diagnosis not present

## 2021-08-26 DIAGNOSIS — R002 Palpitations: Secondary | ICD-10-CM | POA: Diagnosis not present

## 2021-08-26 DIAGNOSIS — E785 Hyperlipidemia, unspecified: Secondary | ICD-10-CM | POA: Diagnosis not present

## 2021-08-26 DIAGNOSIS — E890 Postprocedural hypothyroidism: Secondary | ICD-10-CM | POA: Diagnosis not present

## 2021-08-26 DIAGNOSIS — E063 Autoimmune thyroiditis: Secondary | ICD-10-CM | POA: Diagnosis not present

## 2021-08-27 DIAGNOSIS — E785 Hyperlipidemia, unspecified: Secondary | ICD-10-CM | POA: Diagnosis not present

## 2021-08-27 DIAGNOSIS — E063 Autoimmune thyroiditis: Secondary | ICD-10-CM | POA: Diagnosis not present

## 2021-08-27 DIAGNOSIS — E890 Postprocedural hypothyroidism: Secondary | ICD-10-CM | POA: Diagnosis not present

## 2021-08-28 ENCOUNTER — Other Ambulatory Visit: Payer: Self-pay

## 2021-08-28 MED ORDER — PROPRANOLOL HCL 10 MG PO TABS
ORAL_TABLET | ORAL | 0 refills | Status: AC
  Filled 2021-08-28: qty 30, 15d supply, fill #0

## 2021-08-28 MED ORDER — ALPRAZOLAM 0.25 MG PO TABS
ORAL_TABLET | ORAL | 0 refills | Status: AC
  Filled 2021-08-28: qty 30, 30d supply, fill #0

## 2021-09-07 DIAGNOSIS — L570 Actinic keratosis: Secondary | ICD-10-CM | POA: Diagnosis not present

## 2021-09-08 DIAGNOSIS — R002 Palpitations: Secondary | ICD-10-CM | POA: Diagnosis not present

## 2021-09-09 DIAGNOSIS — R002 Palpitations: Secondary | ICD-10-CM | POA: Diagnosis not present

## 2021-09-09 DIAGNOSIS — E063 Autoimmune thyroiditis: Secondary | ICD-10-CM | POA: Diagnosis not present

## 2021-09-09 DIAGNOSIS — I493 Ventricular premature depolarization: Secondary | ICD-10-CM | POA: Diagnosis not present

## 2021-09-09 DIAGNOSIS — E890 Postprocedural hypothyroidism: Secondary | ICD-10-CM | POA: Diagnosis not present

## 2021-09-09 DIAGNOSIS — E785 Hyperlipidemia, unspecified: Secondary | ICD-10-CM | POA: Diagnosis not present

## 2021-09-09 DIAGNOSIS — E0500 Thyrotoxicosis with diffuse goiter without thyrotoxic crisis or storm: Secondary | ICD-10-CM | POA: Diagnosis not present

## 2021-09-17 ENCOUNTER — Other Ambulatory Visit: Payer: Self-pay

## 2021-10-29 DIAGNOSIS — E785 Hyperlipidemia, unspecified: Secondary | ICD-10-CM | POA: Diagnosis not present

## 2021-10-29 DIAGNOSIS — I493 Ventricular premature depolarization: Secondary | ICD-10-CM | POA: Diagnosis not present

## 2021-10-29 DIAGNOSIS — E890 Postprocedural hypothyroidism: Secondary | ICD-10-CM | POA: Diagnosis not present

## 2021-10-29 DIAGNOSIS — E063 Autoimmune thyroiditis: Secondary | ICD-10-CM | POA: Diagnosis not present

## 2021-11-04 ENCOUNTER — Other Ambulatory Visit: Payer: Self-pay

## 2021-11-04 DIAGNOSIS — E785 Hyperlipidemia, unspecified: Secondary | ICD-10-CM | POA: Diagnosis not present

## 2021-11-04 DIAGNOSIS — Z1211 Encounter for screening for malignant neoplasm of colon: Secondary | ICD-10-CM | POA: Diagnosis not present

## 2021-11-04 DIAGNOSIS — E063 Autoimmune thyroiditis: Secondary | ICD-10-CM | POA: Diagnosis not present

## 2021-11-04 DIAGNOSIS — R011 Cardiac murmur, unspecified: Secondary | ICD-10-CM | POA: Diagnosis not present

## 2021-11-04 DIAGNOSIS — I493 Ventricular premature depolarization: Secondary | ICD-10-CM | POA: Diagnosis not present

## 2021-11-04 DIAGNOSIS — E0500 Thyrotoxicosis with diffuse goiter without thyrotoxic crisis or storm: Secondary | ICD-10-CM | POA: Diagnosis not present

## 2021-11-04 DIAGNOSIS — E890 Postprocedural hypothyroidism: Secondary | ICD-10-CM | POA: Diagnosis not present

## 2021-11-04 MED ORDER — LEVOTHYROXINE SODIUM 75 MCG PO TABS
ORAL_TABLET | ORAL | 3 refills | Status: AC
  Filled 2021-11-04: qty 90, 90d supply, fill #0
  Filled 2022-06-14: qty 90, 90d supply, fill #1
  Filled 2022-10-23: qty 90, 90d supply, fill #2

## 2021-11-30 DIAGNOSIS — Z01419 Encounter for gynecological examination (general) (routine) without abnormal findings: Secondary | ICD-10-CM | POA: Diagnosis not present

## 2021-11-30 DIAGNOSIS — Z1231 Encounter for screening mammogram for malignant neoplasm of breast: Secondary | ICD-10-CM | POA: Diagnosis not present

## 2021-11-30 DIAGNOSIS — Z6820 Body mass index (BMI) 20.0-20.9, adult: Secondary | ICD-10-CM | POA: Diagnosis not present

## 2022-05-05 ENCOUNTER — Other Ambulatory Visit: Payer: Self-pay

## 2022-05-05 MED ORDER — COVID-19 MRNA 2023-2024 VACCINE (COMIRNATY) 0.3 ML INJECTION
0.3000 mL | Freq: Once | INTRAMUSCULAR | 0 refills | Status: AC
  Filled 2022-05-07: qty 0.3, 1d supply, fill #0

## 2022-05-07 ENCOUNTER — Other Ambulatory Visit: Payer: Self-pay

## 2022-06-14 ENCOUNTER — Other Ambulatory Visit: Payer: Self-pay

## 2022-06-28 DIAGNOSIS — Z872 Personal history of diseases of the skin and subcutaneous tissue: Secondary | ICD-10-CM | POA: Diagnosis not present

## 2022-06-28 DIAGNOSIS — Z86018 Personal history of other benign neoplasm: Secondary | ICD-10-CM | POA: Diagnosis not present

## 2022-06-28 DIAGNOSIS — L578 Other skin changes due to chronic exposure to nonionizing radiation: Secondary | ICD-10-CM | POA: Diagnosis not present

## 2022-10-24 ENCOUNTER — Other Ambulatory Visit: Payer: Self-pay

## 2022-10-29 DIAGNOSIS — E890 Postprocedural hypothyroidism: Secondary | ICD-10-CM | POA: Diagnosis not present

## 2022-10-29 DIAGNOSIS — E063 Autoimmune thyroiditis: Secondary | ICD-10-CM | POA: Diagnosis not present

## 2022-10-29 DIAGNOSIS — E785 Hyperlipidemia, unspecified: Secondary | ICD-10-CM | POA: Diagnosis not present

## 2022-10-29 DIAGNOSIS — E0500 Thyrotoxicosis with diffuse goiter without thyrotoxic crisis or storm: Secondary | ICD-10-CM | POA: Diagnosis not present

## 2022-11-05 ENCOUNTER — Other Ambulatory Visit: Payer: Self-pay

## 2022-11-05 MED ORDER — SYNTHROID 88 MCG PO TABS
88.0000 ug | ORAL_TABLET | Freq: Every day | ORAL | 1 refills | Status: AC
  Filled 2022-11-05: qty 30, 30d supply, fill #0
  Filled 2022-11-05: qty 90, 90d supply, fill #0
  Filled 2022-12-09: qty 30, 30d supply, fill #1

## 2022-12-09 ENCOUNTER — Other Ambulatory Visit: Payer: Self-pay

## 2022-12-28 DIAGNOSIS — E785 Hyperlipidemia, unspecified: Secondary | ICD-10-CM | POA: Diagnosis not present

## 2022-12-28 DIAGNOSIS — E890 Postprocedural hypothyroidism: Secondary | ICD-10-CM | POA: Diagnosis not present

## 2022-12-28 DIAGNOSIS — E063 Autoimmune thyroiditis: Secondary | ICD-10-CM | POA: Diagnosis not present

## 2022-12-28 DIAGNOSIS — E0500 Thyrotoxicosis with diffuse goiter without thyrotoxic crisis or storm: Secondary | ICD-10-CM | POA: Diagnosis not present

## 2023-01-31 ENCOUNTER — Other Ambulatory Visit: Payer: Self-pay

## 2023-01-31 DIAGNOSIS — Z13220 Encounter for screening for lipoid disorders: Secondary | ICD-10-CM | POA: Diagnosis not present

## 2023-01-31 DIAGNOSIS — E039 Hypothyroidism, unspecified: Secondary | ICD-10-CM | POA: Diagnosis not present

## 2023-01-31 DIAGNOSIS — Z6822 Body mass index (BMI) 22.0-22.9, adult: Secondary | ICD-10-CM | POA: Diagnosis not present

## 2023-01-31 DIAGNOSIS — Z1389 Encounter for screening for other disorder: Secondary | ICD-10-CM | POA: Diagnosis not present

## 2023-01-31 MED ORDER — LEVOTHYROXINE SODIUM 88 MCG PO TABS
88.0000 ug | ORAL_TABLET | Freq: Every morning | ORAL | 1 refills | Status: AC
  Filled 2023-01-31: qty 90, 90d supply, fill #0
  Filled 2023-08-02: qty 90, 90d supply, fill #1

## 2023-02-10 ENCOUNTER — Other Ambulatory Visit: Payer: Self-pay

## 2023-02-10 DIAGNOSIS — Z6822 Body mass index (BMI) 22.0-22.9, adult: Secondary | ICD-10-CM | POA: Diagnosis not present

## 2023-02-10 DIAGNOSIS — Z79890 Hormone replacement therapy (postmenopausal): Secondary | ICD-10-CM | POA: Diagnosis not present

## 2023-02-10 DIAGNOSIS — Z01419 Encounter for gynecological examination (general) (routine) without abnormal findings: Secondary | ICD-10-CM | POA: Diagnosis not present

## 2023-02-10 DIAGNOSIS — Z1231 Encounter for screening mammogram for malignant neoplasm of breast: Secondary | ICD-10-CM | POA: Diagnosis not present

## 2023-02-10 MED ORDER — COMBIPATCH 0.05-0.14 MG/DAY TD PTTW
MEDICATED_PATCH | TRANSDERMAL | 3 refills | Status: AC
  Filled 2023-02-10: qty 8, 28d supply, fill #0
  Filled 2023-05-08 – 2023-05-10 (×2): qty 8, 28d supply, fill #1
  Filled 2023-07-19: qty 8, 28d supply, fill #2
  Filled 2023-09-15: qty 8, 28d supply, fill #3

## 2023-02-11 ENCOUNTER — Other Ambulatory Visit: Payer: Self-pay

## 2023-05-09 ENCOUNTER — Other Ambulatory Visit: Payer: Self-pay

## 2023-05-10 ENCOUNTER — Other Ambulatory Visit: Payer: Self-pay

## 2023-05-11 ENCOUNTER — Other Ambulatory Visit: Payer: Self-pay

## 2023-05-13 ENCOUNTER — Other Ambulatory Visit: Payer: Self-pay

## 2023-05-13 MED ORDER — COMIRNATY 30 MCG/0.3ML IM SUSY
PREFILLED_SYRINGE | INTRAMUSCULAR | 0 refills | Status: AC
  Filled 2023-05-13: qty 0.3, 1d supply, fill #0

## 2023-07-20 ENCOUNTER — Other Ambulatory Visit: Payer: Self-pay

## 2023-07-21 ENCOUNTER — Other Ambulatory Visit: Payer: Self-pay

## 2023-07-26 ENCOUNTER — Other Ambulatory Visit: Payer: Self-pay

## 2023-08-03 ENCOUNTER — Other Ambulatory Visit: Payer: Self-pay

## 2023-09-15 ENCOUNTER — Other Ambulatory Visit: Payer: Self-pay

## 2023-09-16 ENCOUNTER — Other Ambulatory Visit: Payer: Self-pay

## 2023-10-20 DIAGNOSIS — Z0000 Encounter for general adult medical examination without abnormal findings: Secondary | ICD-10-CM | POA: Diagnosis not present

## 2023-10-20 DIAGNOSIS — E039 Hypothyroidism, unspecified: Secondary | ICD-10-CM | POA: Diagnosis not present

## 2023-10-20 DIAGNOSIS — Z13220 Encounter for screening for lipoid disorders: Secondary | ICD-10-CM | POA: Diagnosis not present

## 2023-11-01 ENCOUNTER — Other Ambulatory Visit: Payer: Self-pay

## 2023-11-01 DIAGNOSIS — Z1389 Encounter for screening for other disorder: Secondary | ICD-10-CM | POA: Diagnosis not present

## 2023-11-01 DIAGNOSIS — Z0000 Encounter for general adult medical examination without abnormal findings: Secondary | ICD-10-CM | POA: Diagnosis not present

## 2023-11-01 DIAGNOSIS — E039 Hypothyroidism, unspecified: Secondary | ICD-10-CM | POA: Diagnosis not present

## 2023-11-01 DIAGNOSIS — Z23 Encounter for immunization: Secondary | ICD-10-CM | POA: Diagnosis not present

## 2023-11-01 DIAGNOSIS — Z6822 Body mass index (BMI) 22.0-22.9, adult: Secondary | ICD-10-CM | POA: Diagnosis not present

## 2023-11-01 DIAGNOSIS — Z13220 Encounter for screening for lipoid disorders: Secondary | ICD-10-CM | POA: Diagnosis not present

## 2023-11-01 MED ORDER — LEVOTHYROXINE SODIUM 88 MCG PO TABS
88.0000 ug | ORAL_TABLET | Freq: Every morning | ORAL | 3 refills | Status: AC
  Filled 2023-11-01: qty 90, 90d supply, fill #0
  Filled 2024-02-28 – 2024-02-29 (×3): qty 90, 90d supply, fill #1
  Filled 2024-05-25: qty 90, 90d supply, fill #2

## 2023-12-08 DIAGNOSIS — H52223 Regular astigmatism, bilateral: Secondary | ICD-10-CM | POA: Diagnosis not present

## 2023-12-08 DIAGNOSIS — H5213 Myopia, bilateral: Secondary | ICD-10-CM | POA: Diagnosis not present

## 2024-02-29 ENCOUNTER — Other Ambulatory Visit: Payer: Self-pay

## 2024-02-29 ENCOUNTER — Other Ambulatory Visit (HOSPITAL_COMMUNITY): Payer: Self-pay

## 2024-03-01 ENCOUNTER — Encounter: Payer: Self-pay | Admitting: Pharmacist

## 2024-03-01 ENCOUNTER — Other Ambulatory Visit (HOSPITAL_COMMUNITY): Payer: Self-pay

## 2024-03-01 ENCOUNTER — Other Ambulatory Visit: Payer: Self-pay

## 2024-03-05 ENCOUNTER — Other Ambulatory Visit: Payer: Self-pay

## 2024-03-05 ENCOUNTER — Other Ambulatory Visit (HOSPITAL_COMMUNITY): Payer: Self-pay

## 2024-03-05 DIAGNOSIS — Z1231 Encounter for screening mammogram for malignant neoplasm of breast: Secondary | ICD-10-CM | POA: Diagnosis not present

## 2024-03-05 DIAGNOSIS — Z124 Encounter for screening for malignant neoplasm of cervix: Secondary | ICD-10-CM | POA: Diagnosis not present

## 2024-03-05 DIAGNOSIS — Z1151 Encounter for screening for human papillomavirus (HPV): Secondary | ICD-10-CM | POA: Diagnosis not present

## 2024-03-05 DIAGNOSIS — Z01419 Encounter for gynecological examination (general) (routine) without abnormal findings: Secondary | ICD-10-CM | POA: Diagnosis not present

## 2024-03-05 DIAGNOSIS — Z13820 Encounter for screening for osteoporosis: Secondary | ICD-10-CM | POA: Diagnosis not present

## 2024-03-05 DIAGNOSIS — Z79890 Hormone replacement therapy (postmenopausal): Secondary | ICD-10-CM | POA: Diagnosis not present

## 2024-03-05 DIAGNOSIS — Z6821 Body mass index (BMI) 21.0-21.9, adult: Secondary | ICD-10-CM | POA: Diagnosis not present

## 2024-03-05 MED ORDER — COMBIPATCH 0.05-0.14 MG/DAY TD PTTW
1.0000 | MEDICATED_PATCH | TRANSDERMAL | 3 refills | Status: AC
  Filled 2024-03-05: qty 8, 28d supply, fill #0
  Filled 2024-05-08 – 2024-05-25 (×2): qty 8, 28d supply, fill #1
  Filled 2024-08-02 – 2024-08-06 (×3): qty 24, 84d supply, fill #2

## 2024-05-09 ENCOUNTER — Encounter: Payer: Self-pay | Admitting: Pharmacist

## 2024-05-09 ENCOUNTER — Other Ambulatory Visit: Payer: Self-pay

## 2024-05-11 ENCOUNTER — Other Ambulatory Visit: Payer: Self-pay

## 2024-05-14 ENCOUNTER — Other Ambulatory Visit (HOSPITAL_COMMUNITY): Payer: Self-pay

## 2024-05-25 ENCOUNTER — Other Ambulatory Visit (HOSPITAL_BASED_OUTPATIENT_CLINIC_OR_DEPARTMENT_OTHER): Payer: Self-pay

## 2024-05-25 ENCOUNTER — Other Ambulatory Visit: Payer: Self-pay

## 2024-07-02 ENCOUNTER — Other Ambulatory Visit (HOSPITAL_COMMUNITY): Payer: Self-pay

## 2024-08-02 ENCOUNTER — Other Ambulatory Visit: Payer: Self-pay

## 2024-08-02 ENCOUNTER — Other Ambulatory Visit (HOSPITAL_COMMUNITY): Payer: Self-pay

## 2024-08-03 ENCOUNTER — Other Ambulatory Visit (HOSPITAL_COMMUNITY): Payer: Self-pay

## 2024-08-03 ENCOUNTER — Other Ambulatory Visit: Payer: Self-pay

## 2024-08-03 ENCOUNTER — Encounter (HOSPITAL_COMMUNITY): Payer: Self-pay | Admitting: Pharmacist

## 2024-08-06 ENCOUNTER — Other Ambulatory Visit (HOSPITAL_COMMUNITY): Payer: Self-pay

## 2024-08-07 ENCOUNTER — Other Ambulatory Visit: Payer: Self-pay
